# Patient Record
Sex: Female | Born: 1950 | Race: Black or African American | Hispanic: No | Smoking: Never smoker
Health system: Southern US, Community
[De-identification: ages and names within clinical notes are randomized; demographics above are authoritative.]

## PROBLEM LIST (undated history)

## (undated) DIAGNOSIS — K219 Gastro-esophageal reflux disease without esophagitis: Secondary | ICD-10-CM

## (undated) DIAGNOSIS — R002 Palpitations: Secondary | ICD-10-CM

## (undated) DIAGNOSIS — I10 Essential (primary) hypertension: Secondary | ICD-10-CM

---

## 2007-01-20 ENCOUNTER — Ambulatory Visit (HOSPITAL_COMMUNITY): Admission: RE | Admit: 2007-01-20 | Discharge: 2007-01-20 | Payer: Self-pay | Admitting: Family Medicine

## 2007-04-16 ENCOUNTER — Emergency Department (HOSPITAL_COMMUNITY): Admission: EM | Admit: 2007-04-16 | Discharge: 2007-04-16 | Payer: Self-pay | Admitting: Emergency Medicine

## 2007-04-20 ENCOUNTER — Emergency Department (HOSPITAL_COMMUNITY): Admission: EM | Admit: 2007-04-20 | Discharge: 2007-04-20 | Payer: Self-pay | Admitting: Emergency Medicine

## 2008-01-30 ENCOUNTER — Ambulatory Visit (HOSPITAL_COMMUNITY): Admission: RE | Admit: 2008-01-30 | Discharge: 2008-01-30 | Payer: Self-pay | Admitting: Family Medicine

## 2008-02-22 ENCOUNTER — Emergency Department (HOSPITAL_COMMUNITY): Admission: EM | Admit: 2008-02-22 | Discharge: 2008-02-22 | Payer: Self-pay | Admitting: Emergency Medicine

## 2009-04-29 ENCOUNTER — Ambulatory Visit (HOSPITAL_COMMUNITY): Admission: RE | Admit: 2009-04-29 | Discharge: 2009-04-29 | Payer: Self-pay | Admitting: Family Medicine

## 2011-01-04 ENCOUNTER — Encounter: Payer: Self-pay | Admitting: Family Medicine

## 2011-02-15 ENCOUNTER — Emergency Department (HOSPITAL_COMMUNITY): Payer: Self-pay

## 2011-02-15 ENCOUNTER — Emergency Department (HOSPITAL_COMMUNITY)
Admission: EM | Admit: 2011-02-15 | Discharge: 2011-02-15 | Disposition: A | Discharge status: Home / Self Care | Payer: Self-pay | Attending: Emergency Medicine | Admitting: Emergency Medicine

## 2011-02-15 DIAGNOSIS — I4949 Other premature depolarization: Secondary | ICD-10-CM | POA: Insufficient documentation

## 2011-02-15 DIAGNOSIS — E876 Hypokalemia: Secondary | ICD-10-CM | POA: Insufficient documentation

## 2011-02-15 LAB — DIFFERENTIAL
Basophils Absolute: 0 10*3/uL (ref 0.0–0.1)
Basophils Relative: 0 % (ref 0–1)
Eosinophils Relative: 5 % (ref 0–5)
Lymphs Abs: 2.3 10*3/uL (ref 0.7–4.0)
Monocytes Absolute: 0.6 10*3/uL (ref 0.1–1.0)
Monocytes Relative: 8 % (ref 3–12)

## 2011-02-15 LAB — CBC
Hemoglobin: 13.2 g/dL (ref 12.0–15.0)
MCH: 29 pg (ref 26.0–34.0)
MCV: 88.4 fL (ref 78.0–100.0)
Platelets: 286 10*3/uL (ref 150–400)
WBC: 6.9 10*3/uL (ref 4.0–10.5)

## 2011-02-15 LAB — POCT CARDIAC MARKERS
CKMB, poc: 1.1 ng/mL (ref 1.0–8.0)
Troponin i, poc: <0.05 ng/mL (ref 0.00–0.09)

## 2011-02-15 LAB — BASIC METABOLIC PANEL
CO2: 24 mEq/L (ref 19–32)
Chloride: 106 mEq/L (ref 96–112)
Creatinine, Ser: 0.81 mg/dL (ref 0.4–1.2)
Potassium: 3.3 mEq/L — ABNORMAL LOW (ref 3.5–5.1)

## 2011-02-18 ENCOUNTER — Emergency Department (HOSPITAL_COMMUNITY): Payer: Self-pay

## 2011-02-18 ENCOUNTER — Emergency Department (HOSPITAL_COMMUNITY)
Admission: EM | Admit: 2011-02-18 | Discharge: 2011-02-18 | Disposition: A | Discharge status: Home / Self Care | Payer: Self-pay | Attending: Emergency Medicine | Admitting: Emergency Medicine

## 2011-02-18 DIAGNOSIS — K219 Gastro-esophageal reflux disease without esophagitis: Secondary | ICD-10-CM | POA: Insufficient documentation

## 2011-02-18 DIAGNOSIS — R002 Palpitations: Secondary | ICD-10-CM | POA: Insufficient documentation

## 2011-02-18 LAB — BASIC METABOLIC PANEL
BUN: 10 mg/dL (ref 6–23)
Chloride: 104 mEq/L (ref 96–112)
Creatinine, Ser: 0.8 mg/dL (ref 0.4–1.2)
GFR calc Af Amer: >60 mL/min (ref >60)
Glucose, Bld: 85 mg/dL (ref 70–99)
Potassium: 4 mEq/L (ref 3.5–5.1)

## 2011-02-18 LAB — CBC
HCT: 39.4 % (ref 36.0–46.0)
Hemoglobin: 12.9 g/dL (ref 12.0–15.0)
MCH: 29.2 pg (ref 26.0–34.0)
MCHC: 32.7 g/dL (ref 30.0–36.0)
Platelets: 300 10*3/uL (ref 150–400)
RBC: 4.42 MIL/uL (ref 3.87–5.11)
WBC: 6.7 10*3/uL (ref 4.0–10.5)

## 2011-09-07 LAB — URINALYSIS, ROUTINE W REFLEX MICROSCOPIC
Glucose, UA: NEGATIVE
Hgb urine dipstick: NEGATIVE
Protein, ur: NEGATIVE
pH: 5.5

## 2011-09-07 LAB — URINE CULTURE

## 2011-09-09 ENCOUNTER — Emergency Department (HOSPITAL_COMMUNITY)
Admission: EM | Admit: 2011-09-09 | Discharge: 2011-09-09 | Disposition: A | Discharge status: Home / Self Care | Payer: Self-pay | Attending: Emergency Medicine | Admitting: Emergency Medicine

## 2011-09-09 ENCOUNTER — Encounter: Payer: Self-pay | Admitting: Emergency Medicine

## 2011-09-09 DIAGNOSIS — B372 Candidiasis of skin and nail: Secondary | ICD-10-CM | POA: Insufficient documentation

## 2011-09-09 DIAGNOSIS — X58XXXA Exposure to other specified factors, initial encounter: Secondary | ICD-10-CM | POA: Insufficient documentation

## 2011-09-09 DIAGNOSIS — T148XXA Other injury of unspecified body region, initial encounter: Secondary | ICD-10-CM | POA: Insufficient documentation

## 2011-09-09 MED ORDER — HYDROXYZINE HCL 25 MG PO TABS
25.0000 mg | ORAL_TABLET | Freq: Once | ORAL | Status: AC
  Administered 2011-09-09: 25 mg via ORAL
  Filled 2011-09-09: qty 1

## 2011-09-09 MED ORDER — CLOTRIMAZOLE 1 % EX CREA: TOPICAL_CREAM | CUTANEOUS | Status: AC

## 2011-09-09 MED ORDER — HYDROXYZINE HCL 25 MG PO TABS: 25.0000 mg | ORAL_TABLET | Freq: Four times a day (QID) | ORAL | Status: AC

## 2011-09-09 NOTE — ED Provider Notes (Signed)
History     CSN: [id number] Arrival date & time: 09/09/2011  1:36 PM  Chief Complaint  Patient presents with  . Rash    (Consider location/radiation/quality/duration/timing/severity/associated sxs/prior treatment) HPI Comments: Patient c/o itching rash to her perineal area and groin.  Also c/o itching and increased moisture to the area.  States she was seen at the health dept for same and treated with steroid cream w/o improvement.  She denies swelling, fever, or hx of diabetes or new detergents or chemical exposure.    Patient is a 60 y.o. female presenting with rash. The history is provided by the patient.  Rash  This is a new problem. The current episode started more than 1 week ago. The problem has not changed since onset.The problem is associated with nothing. There has been no fever. The rash is present on the groin and genitalia. The patient is experiencing no pain. Associated symptoms include itching and weeping. Pertinent negatives include no blisters and no pain. She has tried anti-itch cream and steriods for the symptoms. The treatment provided no relief.    History reviewed. No pertinent past medical history.  History reviewed. No pertinent past surgical history.  No family history on file.  History  Substance Use Topics  . Smoking status: Never Smoker   . Smokeless tobacco: Not on file  . Alcohol Use: No    OB History    Grav Para Term Preterm Abortions TAB SAB Ect Mult Living                  Review of Systems  Constitutional: Negative for fever and chills.  HENT: Negative for neck pain and neck stiffness.   Genitourinary: Negative for dysuria, vaginal bleeding, vaginal discharge and vaginal pain.  Musculoskeletal: Negative.   Skin: Positive for color change, itching and rash.  Neurological: Negative for weakness and numbness.  Hematological: Does not bruise/bleed easily.  All other systems reviewed and are negative.    Allergies  Codeine  Home  Medications  No current outpatient prescriptions on file.  BP 117/64  Pulse 61  Temp(Src) 98.5 F (36.9 C) (Oral)  Resp 20  Ht 5\' 2"  (1.575 m)  Wt 163 lb (73.936 kg)  BMI 29.81 kg/m2  SpO2 100%  Physical Exam  Nursing note and vitals reviewed. Constitutional: She is oriented to person, place, and time. She appears well-developed and well-nourished.  HENT:  Head: Normocephalic and atraumatic.  Mouth/Throat: Oropharynx is clear and moist.  Cardiovascular: Normal rate, regular rhythm and normal heart sounds.   Pulmonary/Chest: Effort normal and breath sounds normal.  Abdominal: Soft. She exhibits no distension and no mass. There is no tenderness. There is no rebound and no guarding.  Musculoskeletal: She exhibits no edema and no tenderness.  Neurological: She is alert and oriented to person, place, and time. She exhibits normal muscle tone. Coordination normal.  Skin: Laceration and rash noted. No bruising, no ecchymosis and no petechiae noted. Rash is macular. Rash is not nodular and not urticarial. There is erythema.       Confluent , erythematous macular rash to the bilateral groin and perineal region.  Mild weeping of the skin folds of the groin.     ED Course  Procedures (including critical care time)  Labs Reviewed - No data to display No results found.   No diagnosis found.    MDM    2:46 PM erythematous slightly weeping rash to the bilateral groin and perineal area.  Currently using triamcinolone cream w/o  improvement.  Rash appears fungal .  Will try clotrimazole and d/c her steroid cream.    09/09/2011 Patient / Family / Caregiver understand and agree with initial ED impression and plan with expectations set for ED visit.         Tammy L. Trisha Mangle, [place] 09/09/11 2125  Medical screening examination/treatment/procedure(s) were performed by non-physician practitioner and as supervising physician I was immediately available for  consultation/collaboration.Helmut Muster, MD 09/10/11 [phone]

## 2011-09-09 NOTE — ED Notes (Signed)
Pt c/o itchy rash to groin and upper thighs x 1 week. Pt seen at health dept and given triamcinolone cream. nad noted.

## 2011-12-29 ENCOUNTER — Other Ambulatory Visit (HOSPITAL_COMMUNITY): Payer: Self-pay | Admitting: Family Medicine

## 2011-12-29 DIAGNOSIS — N63 Unspecified lump in unspecified breast: Secondary | ICD-10-CM

## 2012-01-06 ENCOUNTER — Ambulatory Visit (HOSPITAL_COMMUNITY)
Admission: RE | Admit: 2012-01-06 | Discharge: 2012-01-06 | Disposition: A | Discharge status: Home / Self Care | Payer: PRIVATE HEALTH INSURANCE | Source: Ambulatory Visit | Attending: Family Medicine | Admitting: Family Medicine

## 2012-01-06 ENCOUNTER — Other Ambulatory Visit (HOSPITAL_COMMUNITY): Payer: Self-pay | Admitting: Family Medicine

## 2012-01-06 DIAGNOSIS — N63 Unspecified lump in unspecified breast: Secondary | ICD-10-CM

## 2013-02-04 ENCOUNTER — Emergency Department (HOSPITAL_COMMUNITY): Payer: Self-pay

## 2013-02-04 ENCOUNTER — Encounter (HOSPITAL_COMMUNITY): Payer: Self-pay | Admitting: *Deleted

## 2013-02-04 ENCOUNTER — Emergency Department (HOSPITAL_COMMUNITY)
Admission: EM | Admit: 2013-02-04 | Discharge: 2013-02-04 | Disposition: A | Discharge status: Home / Self Care | Payer: Self-pay | Attending: Emergency Medicine | Admitting: Emergency Medicine

## 2013-02-04 DIAGNOSIS — Z79899 Other long term (current) drug therapy: Secondary | ICD-10-CM | POA: Insufficient documentation

## 2013-02-04 DIAGNOSIS — R42 Dizziness and giddiness: Secondary | ICD-10-CM | POA: Insufficient documentation

## 2013-02-04 DIAGNOSIS — R002 Palpitations: Secondary | ICD-10-CM | POA: Insufficient documentation

## 2013-02-04 DIAGNOSIS — R51 Headache: Secondary | ICD-10-CM | POA: Insufficient documentation

## 2013-02-04 DIAGNOSIS — I499 Cardiac arrhythmia, unspecified: Secondary | ICD-10-CM | POA: Insufficient documentation

## 2013-02-04 LAB — COMPREHENSIVE METABOLIC PANEL
ALT: 22 U/L (ref 0–35)
Albumin: 3.8 g/dL (ref 3.5–5.2)
Alkaline Phosphatase: 110 U/L (ref 39–117)
BUN: 13 mg/dL (ref 6–23)
Calcium: 9.4 mg/dL (ref 8.4–10.5)
Creatinine, Ser: 0.99 mg/dL (ref 0.50–1.10)
GFR calc Af Amer: 69 mL/min — ABNORMAL LOW (ref >90)
GFR calc non Af Amer: 60 mL/min — ABNORMAL LOW (ref >90)
Potassium: 4 mEq/L (ref 3.5–5.1)
Sodium: 138 mEq/L (ref 135–145)
Total Bilirubin: 0.2 mg/dL — ABNORMAL LOW (ref 0.3–1.2)
Total Protein: 7.5 g/dL (ref 6.0–8.3)

## 2013-02-04 LAB — CBC
HCT: 36.9 % (ref 36.0–46.0)
MCHC: 33.1 g/dL (ref 30.0–36.0)

## 2013-02-04 MED ORDER — METOPROLOL TARTRATE 1 MG/ML IV SOLN
5.0000 mg | Freq: Once | INTRAVENOUS | Status: AC
  Administered 2013-02-04: 5 mg via INTRAVENOUS
  Filled 2013-02-04: qty 5

## 2013-02-04 MED ORDER — METOPROLOL TARTRATE 12.5 MG HALF TABLET: 100.0000 mg | ORAL_TABLET | Freq: Two times a day (BID) | ORAL | Status: DC

## 2013-02-04 NOTE — ED Notes (Signed)
Pt feels as if her heart is fluttering and causes her chest to "tingle" when it happens.

## 2013-02-04 NOTE — ED Provider Notes (Signed)
History    This chart was scribed for Molly Lennert, MD by Molly Cook, ED Scribe. The patient was seen in room APA12/[facility]12 and the patient's care was started at 7:18PM.    CSN: [id number]  Arrival date & time 02/04/13  4098   First MD Initiated Contact with Patient 02/04/13 1909      Chief Complaint  Patient presents with  . Irregular Heart Beat  . Chest Pain    (Consider location/radiation/quality/duration/timing/severity/associated sxs/prior treatment) Patient is a 62 y.o. female presenting with chest pain. The history is provided by the patient. No language interpreter was used.  Chest Pain Pain location:  Substernal area Pain radiates to:  Does not radiate Pain radiates to the back: no   Pain severity:  Moderate Onset quality:  Gradual Duration:  1 day Timing:  Intermittent Progression:  Worsening Chronicity:  Chronic Relieved by:  Nothing Worsened by:  Nothing tried Ineffective treatments:  None tried Associated symptoms: dizziness and headache   Associated symptoms: no fever and no shortness of breath   Associated symptoms comment:  Heart dysrhythmia  Molly Cook is a 62 y.o. female who presents to the Emergency Department complaining of intermittent, mild to moderate, central chest pain with associated heart dysrhythmias with an onset last night. She reports that she has had the chest pain for a while but it worsened yesterday when she started to have heart fluttering. She reports whenever her heart flutters, she has a headache with some dizziness that goes along with it and her chest pain gets aggravated. She reports cold intolerance related to "hormonal problems". No other pertinent medical symptoms.  PCP: [facility] a free clinic  History reviewed. No pertinent past medical history.  History reviewed. No pertinent past surgical history.  History reviewed. No pertinent family history.  History  Substance Use Topics  . Smoking status: Never Smoker   .  Smokeless tobacco: Not on file  . Alcohol Use: No    OB History   Grav Para Term Preterm Abortions TAB SAB Ect Mult Living                  Review of Systems  Constitutional: Negative for fever.  Respiratory: Negative for shortness of breath.   Cardiovascular: Positive for chest pain.  Neurological: Positive for dizziness and headaches.  All other systems reviewed and are negative.    Allergies  Codeine and Penicillins  Home Medications   Current Outpatient Rx  Name  Route  Sig  Dispense  Refill  . COD LIVER OIL PO   Oral   Take 1 tablet by mouth once.         . diphenhydrAMINE (ALLERGY) 25 MG tablet   Oral   Take 25 mg by mouth daily as needed for itching or allergies.         Marland Kitchen ibuprofen (ADVIL,MOTRIN) 200 MG tablet   Oral   Take 100 mg by mouth as needed for pain (for arthritis in hand).         . Multiple Vitamins-Minerals (ALIVE WOMENS 50+) TABS   Oral   Take 0.5 tablets by mouth once.           BP 136/61  Pulse 63  Temp(Src) 97.7 F (36.5 C) (Oral)  Resp 20  Ht 5\' 2"  (1.575 m)  Wt 163 lb (73.936 kg)  BMI 29.81 kg/m2  SpO2 99%  Physical Exam  Constitutional: She is oriented to person, place, and time. She appears well-developed.  HENT:  Head: Normocephalic and atraumatic.  Eyes: Conjunctivae and EOM are normal. No scleral icterus.  Neck: Neck supple. No thyromegaly present.  Cardiovascular: Normal rate.  An irregular rhythm present. Exam reveals no gallop and no friction rub.   No murmur heard. Pulmonary/Chest: No stridor. She has no wheezes. She has no rales. She exhibits no tenderness.  Abdominal: She exhibits no distension. There is no tenderness. There is no rebound.  Musculoskeletal: Normal range of motion. She exhibits no edema.  Lymphadenopathy:    She has no cervical adenopathy.  Neurological: She is oriented to person, place, and time. Coordination normal.  Skin: No rash noted. No erythema.  Psychiatric: She has a normal mood  and affect. Her behavior is normal.    ED Course  Procedures (including critical care time)  DIAGNOSTIC STUDIES: Oxygen Saturation is 99% on room air, normal by my interpretation.    COORDINATION OF CARE:  7:22PM - metoprolol, CXR, CBC, CMP, and troponin I will be ordered for Molly Cook.   8:40PM - lab results reviewed Labs Reviewed  COMPREHENSIVE METABOLIC PANEL - Abnormal; Notable for the following:    Total Bilirubin 0.2 (*)    GFR calc non Af Amer 60 (*)    GFR calc Af Amer 69 (*)    All other components within normal limits  CBC  TROPONIN I   9:30PM - imaging results reviewed and are unremarkable. Dg Chest 2 View  02/04/2013  *RADIOLOGY REPORT*  Clinical Data: Intermittent chronic central chest pain.  CHEST - 2 VIEW  Comparison: 10/23/2012 at [facility].  Findings: Normal sized heart.  Clear lungs with normal vascularity. Lower cervical spine facet degenerative changes.  IMPRESSION: No acute abnormality.   Original Report Authenticated By: Beckie Salts, M.D.      No diagnosis found.   Date: 02/04/2013  [id number]  Rhythm: normal sinus rhythm  With pvc  QRS Axis: normal  Intervals: normal  ST/T Wave abnormalities: nonspecific ST changes  Conduction Disutrbances:none  Narrative Interpretation:   Old EKG Reviewed: none available    MDM    The chart was scribed for me under my direct supervision.  I personally performed the history, physical, and medical decision making and all procedures in the evaluation of this patient.Molly Lennert, MD 02/04/13 [phone]

## 2013-02-05 NOTE — ED Notes (Signed)
Angie with [facility] pharmacy called requesting clarification of lopressor prescription.  Spoke with Dr. Estell Harpin and instructed pharmacist to tell pt to take 12.5mg  po bid dispense 30 tabs.

## 2013-02-22 ENCOUNTER — Encounter: Payer: Self-pay | Admitting: Cardiovascular Disease

## 2013-03-09 ENCOUNTER — Encounter: Payer: Self-pay | Admitting: Internal Medicine

## 2013-05-27 ENCOUNTER — Emergency Department (HOSPITAL_COMMUNITY)
Admission: EM | Admit: 2013-05-27 | Discharge: 2013-05-27 | Disposition: A | Discharge status: Home / Self Care | Payer: Self-pay | Attending: Emergency Medicine | Admitting: Emergency Medicine

## 2013-05-27 ENCOUNTER — Encounter (HOSPITAL_COMMUNITY): Payer: Self-pay

## 2013-05-27 DIAGNOSIS — R6889 Other general symptoms and signs: Secondary | ICD-10-CM | POA: Insufficient documentation

## 2013-05-27 DIAGNOSIS — Z880 Allergy status to penicillin: Secondary | ICD-10-CM | POA: Insufficient documentation

## 2013-05-27 DIAGNOSIS — J329 Chronic sinusitis, unspecified: Secondary | ICD-10-CM | POA: Insufficient documentation

## 2013-05-27 DIAGNOSIS — Y939 Activity, unspecified: Secondary | ICD-10-CM | POA: Insufficient documentation

## 2013-05-27 DIAGNOSIS — S90861A Insect bite (nonvenomous), right foot, initial encounter: Secondary | ICD-10-CM

## 2013-05-27 DIAGNOSIS — R197 Diarrhea, unspecified: Secondary | ICD-10-CM | POA: Insufficient documentation

## 2013-05-27 DIAGNOSIS — Z8719 Personal history of other diseases of the digestive system: Secondary | ICD-10-CM | POA: Insufficient documentation

## 2013-05-27 DIAGNOSIS — Y929 Unspecified place or not applicable: Secondary | ICD-10-CM | POA: Insufficient documentation

## 2013-05-27 DIAGNOSIS — IMO0002 Reserved for concepts with insufficient information to code with codable children: Secondary | ICD-10-CM | POA: Insufficient documentation

## 2013-05-27 DIAGNOSIS — J3489 Other specified disorders of nose and nasal sinuses: Secondary | ICD-10-CM | POA: Insufficient documentation

## 2013-05-27 HISTORY — DX: Palpitations: R00.2

## 2013-05-27 HISTORY — DX: Gastro-esophageal reflux disease without esophagitis: K21.9

## 2013-05-27 MED ORDER — DOXYCYCLINE HYCLATE 100 MG PO TABS: 100.0000 mg | ORAL_TABLET | Freq: Two times a day (BID) | ORAL | Status: DC

## 2013-05-27 NOTE — ED Provider Notes (Signed)
History     CSN: [id number]  Arrival date & time 05/27/13  1153   First MD Initiated Contact with Patient 05/27/13 1308      Chief Complaint  Patient presents with  . Headache    HPI Pt was seen at 1310.  Per pt, c/o gradual onset and persistence of constant multiple symptoms that began 1 to 2 days ago. Pt states she has had a dull vertex headache, described as "pressure." Has been associated with sinus and ears congestion, nasal congestion, sneezing, runny nose and "fingertips tingling." Pt states she took an OTC benadryl with relief of all her symptoms. Pt also c/o intermittent generalized abd "cramping" associated with loose stools that began today. Pt states all her symptoms have started approx 3 to 4 days after she "found a tick on me and pulled it off."  Pt concerned re: RMSF.  Denies fevers, no N/V/D, no abd pain, no neck or back pain, no focal motor weakness, no tingling/numbness in extremities, no visual changes, no slurred speech, no facial droop, no rash. Denies headache was sudden or maximal in onset or at any time.       Past Medical History  Diagnosis Date  . GERD (gastroesophageal reflux disease)   . Palpitations     History reviewed. No pertinent past surgical history.   History  Substance Use Topics  . Smoking status: Never Smoker   . Smokeless tobacco: Not on file  . Alcohol Use: No      Review of Systems ROS: Statement: All systems negative except as marked or noted in the HPI; Constitutional: Negative for fever and chills. ; ; Eyes: Negative for eye pain, redness and discharge. ; ; ENMT: Negative for ear pain, hoarseness, sore throat. +sneezing, nasal and ears congestion, sinus pressure. ; ; Cardiovascular: Negative for chest pain, palpitations, diaphoresis, dyspnea and peripheral edema. ; ; Respiratory: Negative for cough, wheezing and stridor. ; ; Gastrointestinal: +abd "cramps," "loose stools." Negative for nausea, vomiting, abdominal pain, blood in stool,  hematemesis, jaundice and rectal bleeding. . ; ; Genitourinary: Negative for dysuria, flank pain and hematuria. ; ; Musculoskeletal: Negative for back pain and neck pain. Negative for swelling and trauma.; ; Skin: Negative for pruritus, rash, abrasions, blisters, bruising and skin lesion.; ; Neuro: +headache, paresthesias. Negative for lightheadedness and neck stiffness. Negative for weakness, altered level of consciousness , altered mental status, extremity weakness, involuntary movement, seizure and syncope.       Allergies  Codeine and Penicillins  Home Medications   Current Outpatient Rx  Name  Route  Sig  Dispense  Refill  . diphenhydrAMINE (ALLERGY) 25 MG tablet   Oral   Take 25 mg by mouth daily as needed for itching or allergies.         Marland Kitchen ibuprofen (ADVIL,MOTRIN) 200 MG tablet   Oral   Take 100 mg by mouth as needed for pain (for arthritis in hand).         . doxycycline (VIBRA-TABS) 100 MG tablet   Oral   Take 1 tablet (100 mg total) by mouth 2 (two) times daily.   28 tablet   0     BP 125/65  Pulse 65  Temp(Src) 98.1 F (36.7 C) (Oral)  Resp 18  Ht 5\' 2"  (1.575 m)  Wt 163 lb (73.936 kg)  BMI 29.81 kg/m2  SpO2 99%  Physical Exam 1315: Physical examination:  Nursing notes reviewed; Vital signs and O2 SAT reviewed;  Constitutional: Well developed, Well nourished,  Well hydrated, In no acute distress; Head:  Normocephalic, atraumatic; Eyes: EOMI, PERRL, No scleral icterus; ENMT: +clear fluid behind TM's bilat. TM's not erythematous or bulging. +edemetous nasal turbinates bilat with clear rhinorrhea. Mouth and pharynx without lesions. No tonsillar exudates. No intra-oral edema. No submandibular or sublingual edema. No hoarse voice, no drooling, no stridor. No pain with manipulation of larynx. Mouth and pharynx normal, Mucous membranes moist; Neck: Supple, Full range of motion, No lymphadenopathy; Cardiovascular: Regular rate and rhythm, No gallop; Respiratory: Breath  sounds clear & equal bilaterally, No rales, rhonchi, wheezes.  Speaking full sentences with ease, Normal respiratory effort/excursion; Chest: Nontender, Movement normal; Abdomen: Soft, Nontender, Nondistended, Normal bowel sounds; Genitourinary: No CVA tenderness; Spine:  No midline CS, TS, LS tenderness.;; Extremities: Pulses normal, No tenderness, No edema, No calf edema or asymmetry.; Neuro: AA&Ox3, Major CN grossly intact. No facial droop. Speech clear. Climbs on and off stretcher easily by herself. Gait steady. No gross focal motor or sensory deficits in extremities.; Skin: Color normal, Warm, Dry, +punctate area of erythema right lateral proximal foot without open wound, drainage, ecchymosis, tenderness or obvious FB.   ED Course  Procedures     MDM  MDM Reviewed: previous chart, nursing note and vitals     1330:  Pt with vertex headache "pressure," as well as "pressure" and "congestion" in her ears and face when she lays down to sleep. Assoc with sneezing, sinus congestion, runny/stuffy nose; improved with benadryl.  Appears sinusitis/allergies, will tx symptomatically. Pt also c/o various other symptoms: "tingling fingertips," abd cramps, loose stools. Neuro exam intact today, abd exam benign. Pt very concerned that all her symptoms began approx 3 to 4 days after she "pulled a tick off of me" and that they are related to the tick bite. Tick bite site without infection. 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 Pt and her husband both voice that that was their concern. Want to go home now. Dx d/w pt and family.  Questions answered.  Verb understanding, agreeable to d/c home with outpt f/u.        Laray Anger, DO 05/29/13 1346

## 2013-05-27 NOTE — ED Notes (Signed)
Pt presents with c/o headache, sinus pressure that increases when lying down. Reports taking a benadryl product 2 days ago that seemed to ease symptoms. Pt also reports hands tingling and feeling numb x 1 week. Diarrhea started today on way to facility. Reports 3 bouts of diarrhea since leaving her home 2 hrs ago.  NAD noted. Denies N/V  And fever at this time. No vision changes.

## 2013-05-27 NOTE — ED Notes (Signed)
Pt reports pressure in top of head and numbness in both hands x 1 week.  Also c/o intermittent abd cramps.  Reports diarrhea today, no vomiting.

## 2013-11-25 ENCOUNTER — Emergency Department (HOSPITAL_COMMUNITY)
Admission: EM | Admit: 2013-11-25 | Discharge: 2013-11-25 | Disposition: A | Discharge status: Home / Self Care | Payer: Self-pay | Attending: Emergency Medicine | Admitting: Emergency Medicine

## 2013-11-25 ENCOUNTER — Encounter (HOSPITAL_COMMUNITY): Payer: Self-pay | Admitting: Emergency Medicine

## 2013-11-25 DIAGNOSIS — M25559 Pain in unspecified hip: Secondary | ICD-10-CM | POA: Insufficient documentation

## 2013-11-25 DIAGNOSIS — Z792 Long term (current) use of antibiotics: Secondary | ICD-10-CM | POA: Insufficient documentation

## 2013-11-25 DIAGNOSIS — Z8719 Personal history of other diseases of the digestive system: Secondary | ICD-10-CM | POA: Insufficient documentation

## 2013-11-25 DIAGNOSIS — Z79899 Other long term (current) drug therapy: Secondary | ICD-10-CM | POA: Insufficient documentation

## 2013-11-25 DIAGNOSIS — Z880 Allergy status to penicillin: Secondary | ICD-10-CM | POA: Insufficient documentation

## 2013-11-25 DIAGNOSIS — M545 Low back pain, unspecified: Secondary | ICD-10-CM | POA: Insufficient documentation

## 2013-11-25 DIAGNOSIS — Z8619 Personal history of other infectious and parasitic diseases: Secondary | ICD-10-CM | POA: Insufficient documentation

## 2013-11-25 DIAGNOSIS — R109 Unspecified abdominal pain: Secondary | ICD-10-CM | POA: Insufficient documentation

## 2013-11-25 LAB — URINALYSIS, ROUTINE W REFLEX MICROSCOPIC
Bilirubin Urine: NEGATIVE
Hgb urine dipstick: NEGATIVE
Specific Gravity, Urine: <1.005 — ABNORMAL LOW (ref 1.005–1.030)
pH: 6 (ref 5.0–8.0)

## 2013-11-25 MED ORDER — CYCLOBENZAPRINE HCL 10 MG PO TABS: 10.0000 mg | ORAL_TABLET | Freq: Three times a day (TID) | ORAL | Status: DC | PRN

## 2013-11-25 MED ORDER — NAPROXEN 500 MG PO TABS: 500.0000 mg | ORAL_TABLET | Freq: Two times a day (BID) | ORAL | Status: DC

## 2013-11-25 MED ORDER — IBUPROFEN 800 MG PO TABS
800.0000 mg | ORAL_TABLET | Freq: Once | ORAL | Status: AC
  Administered 2013-11-25: 800 mg via ORAL
  Filled 2013-11-25: qty 1

## 2013-11-25 NOTE — ED Notes (Signed)
Pt c/o left sided hip, back, and flank pain. No urinary symptoms.

## 2013-11-25 NOTE — ED Provider Notes (Signed)
CSN: [id number]     Arrival date & time 11/25/13  2029 History   First MD Initiated Contact with Patient 11/25/13 2112     Chief Complaint  Patient presents with  . Hip Pain  . Flank Pain   (Consider location/radiation/quality/duration/timing/severity/associated sxs/prior Treatment) Patient is a 62 y.o. female presenting with hip pain and flank pain.  Hip Pain  Flank Pain   Pt reports several weeks of intermittent moderate aching pain in L lower back radiating into her L buttock and worse with movement. Pt states she was seen by MD in [place] and told she had 'an infection' and put on Clindamycin which she was taking intermittently when she had pain. Has also been taking APAP with some relief. She is currently asymptomatic.    Past Medical History  Diagnosis Date  . GERD (gastroesophageal reflux disease)   . Palpitations    History reviewed. No pertinent past surgical history. History reviewed. No pertinent family history. History  Substance Use Topics  . Smoking status: Never Smoker   . Smokeless tobacco: Not on file  . Alcohol Use: No   OB History   Grav Para Term Preterm Abortions TAB SAB Ect Mult Living                 Review of Systems  Genitourinary: Positive for flank pain.   All other systems reviewed and are negative except as noted in HPI.   Allergies  Codeine and Penicillins  Home Medications   Current Outpatient Rx  Name  Route  Sig  Dispense  Refill  . acetaminophen (TYLENOL) 500 MG tablet   Oral   Take 500 mg by mouth every 6 (six) hours as needed.         . clindamycin (CLEOCIN) 300 MG capsule   Oral   Take 300 mg by mouth every 8 (eight) hours. 10 day course starting on 11/06/2013         . Cod Liver Oil CAPS   Oral   Take 1 capsule by mouth daily.          BP 152/66  Pulse 68  Temp(Src) 97.8 F (36.6 C) (Oral)  Resp 20  Ht 5\' 2"  (1.575 m)  Wt 170 lb (77.111 kg)  BMI 31.09 kg/m2  SpO2 98% Physical Exam  Nursing note and  vitals reviewed. Constitutional: She is oriented to person, place, and time. She appears well-developed and well-nourished.  HENT:  Head: Normocephalic and atraumatic.  Eyes: EOM are normal. Pupils are equal, round, and reactive to light.  Neck: Normal range of motion. Neck supple.  Cardiovascular: Normal rate, normal heart sounds and intact distal pulses.   Pulmonary/Chest: Effort normal and breath sounds normal.  Abdominal: Bowel sounds are normal. She exhibits no distension. There is no tenderness.  Musculoskeletal: Normal range of motion. She exhibits no edema and no tenderness.  Neurological: She is alert and oriented to person, place, and time. She has normal strength. She displays normal reflexes. No cranial nerve deficit or sensory deficit.  Skin: Skin is warm and dry. No rash noted.  Psychiatric: She has a normal mood and affect.    ED Course  Procedures (including critical care time) Labs Review Labs Reviewed  URINALYSIS, ROUTINE W REFLEX MICROSCOPIC - Abnormal; Notable for the following:    Specific Gravity, Urine <1.005 (*)    All other components within normal limits   Imaging Review No results found.  EKG Interpretation   None  MDM   1. Low back pain     Likely muscle spasm/lumbago vs sciatica. No concern for UTI or other renal disease. NSAID, flexeril, PCP followup.     Charles B. Bernette Mayers, MD 11/25/13 2120

## 2014-09-03 ENCOUNTER — Other Ambulatory Visit (HOSPITAL_COMMUNITY): Payer: Self-pay | Admitting: Physician Assistant

## 2014-09-03 DIAGNOSIS — Z1231 Encounter for screening mammogram for malignant neoplasm of breast: Secondary | ICD-10-CM

## 2014-09-14 ENCOUNTER — Ambulatory Visit (HOSPITAL_COMMUNITY): Payer: Self-pay

## 2014-09-21 ENCOUNTER — Ambulatory Visit (HOSPITAL_COMMUNITY): Payer: Self-pay

## 2014-11-26 ENCOUNTER — Emergency Department (HOSPITAL_COMMUNITY)
Admission: EM | Admit: 2014-11-26 | Discharge: 2014-11-26 | Disposition: A | Discharge status: Home / Self Care | Payer: Self-pay | Attending: Emergency Medicine | Admitting: Emergency Medicine

## 2014-11-26 ENCOUNTER — Encounter (HOSPITAL_COMMUNITY): Payer: Self-pay | Admitting: *Deleted

## 2014-11-26 ENCOUNTER — Emergency Department (HOSPITAL_COMMUNITY): Payer: Self-pay

## 2014-11-26 DIAGNOSIS — R002 Palpitations: Secondary | ICD-10-CM | POA: Insufficient documentation

## 2014-11-26 DIAGNOSIS — Z791 Long term (current) use of non-steroidal anti-inflammatories (NSAID): Secondary | ICD-10-CM | POA: Insufficient documentation

## 2014-11-26 DIAGNOSIS — Z79899 Other long term (current) drug therapy: Secondary | ICD-10-CM | POA: Insufficient documentation

## 2014-11-26 DIAGNOSIS — Z880 Allergy status to penicillin: Secondary | ICD-10-CM | POA: Insufficient documentation

## 2014-11-26 DIAGNOSIS — Z8719 Personal history of other diseases of the digestive system: Secondary | ICD-10-CM | POA: Insufficient documentation

## 2014-11-26 LAB — CBC WITH DIFFERENTIAL/PLATELET
BASOS ABS: 0 10*3/uL (ref 0.0–0.1)
BASOS PCT: 0 % (ref 0–1)
EOS PCT: 2 % (ref 0–5)
Eosinophils Absolute: 0.2 10*3/uL (ref 0.0–0.7)
HEMATOCRIT: 43.8 % (ref 36.0–46.0)
Hemoglobin: 14.6 g/dL (ref 12.0–15.0)
LYMPHS PCT: 33 % (ref 12–46)
Lymphs Abs: 2.2 10*3/uL (ref 0.7–4.0)
MCH: 29.4 pg (ref 26.0–34.0)
MCHC: 33.3 g/dL (ref 30.0–36.0)
MCV: 88.1 fL (ref 78.0–100.0)
MONO ABS: 0.5 10*3/uL (ref 0.1–1.0)
Monocytes Relative: 8 % (ref 3–12)
NEUTROS ABS: 3.7 10*3/uL (ref 1.7–7.7)
Neutrophils Relative %: 57 % (ref 43–77)
PLATELETS: 310 10*3/uL (ref 150–400)
RBC: 4.97 MIL/uL (ref 3.87–5.11)
RDW: 14 % (ref 11.5–15.5)
WBC: 6.6 10*3/uL (ref 4.0–10.5)

## 2014-11-26 LAB — BASIC METABOLIC PANEL
ANION GAP: 14 (ref 5–15)
BUN: 8 mg/dL (ref 6–23)
CALCIUM: 10.1 mg/dL (ref 8.4–10.5)
CHLORIDE: 101 meq/L (ref 96–112)
CO2: 24 mEq/L (ref 19–32)
CREATININE: 0.83 mg/dL (ref 0.50–1.10)
GFR calc non Af Amer: 73 mL/min — ABNORMAL LOW (ref >90)
GFR, EST AFRICAN AMERICAN: 85 mL/min — AB (ref >90)
Glucose, Bld: 105 mg/dL — ABNORMAL HIGH (ref 70–99)
Potassium: 3.4 mEq/L — ABNORMAL LOW (ref 3.7–5.3)
SODIUM: 139 meq/L (ref 137–147)

## 2014-11-26 LAB — MAGNESIUM: Magnesium: 2.3 mg/dL (ref 1.5–2.5)

## 2014-11-26 LAB — TROPONIN I

## 2014-11-26 MED ORDER — POTASSIUM CHLORIDE CRYS ER 20 MEQ PO TBCR
40.0000 meq | EXTENDED_RELEASE_TABLET | Freq: Once | ORAL | Status: AC
  Administered 2014-11-26: 40 meq via ORAL
  Filled 2014-11-26: qty 2

## 2014-11-26 NOTE — Discharge Instructions (Signed)
°Emergency Department Resource Guide °1) Find a Doctor and Pay Out of Pocket °Although you won't have to find out who is covered by your insurance plan, it is a good idea to ask around and get recommendations. You will then need to call the office and see if the doctor you have chosen will accept you as a new patient and what types of options they offer for patients who are self-pay. Some doctors offer discounts or will set up payment plans for their patients who do not have insurance, but you will need to ask so you aren't surprised when you get to your appointment. ° °2) Contact Your Local Health Department °Not all health departments have doctors that can see patients for sick visits, but many do, so it is worth a call to see if yours does. If you don't know where your local health department is, you can check in your phone book. The CDC also has a tool to help you locate your state's health department, and many state websites also have listings of all of their local health departments. ° °3) Find a Walk-in Clinic °If your illness is not likely to be very severe or complicated, you may want to try a walk in clinic. These are popping up all over the country in pharmacies, drugstores, and shopping centers. They're usually staffed by nurse practitioners or physician assistants that have been trained to treat common illnesses and complaints. They're usually fairly quick and inexpensive. However, if you have serious medical issues or chronic medical problems, these are probably not your best option. ° °No Primary Care Doctor: °- Call [facility] at  832-8000 - they can help you locate a primary care doctor that  accepts your insurance, provides certain services, etc. °- Physician Referral Service- 1-800-533-3463 ° °Chronic Pain Problems: °Organization         Address  Phone   Notes  °[facility]  (336) 297-2271 Patients need to be referred by their primary care doctor.  ° °Medication  Assistance: °Organization         Address  Phone   Notes  °[place] [facility] [address] °[place], [place] [address] (336) 641-8030 --Must be a resident of [place] °-- Must have NO insurance coverage whatsoever (no Medicaid/ Medicare, etc.) °-- The pt. MUST have a primary care doctor that directs their care regularly and follows them in the community °  °[facility]  (866) 331-1348   °[employer]  (888) 892-1162   ° °Agencies that provide inexpensive medical care: °Organization         Address  Phone   Notes  °[facility]  (336) 832-8035   °[facility]    (336) 832-7272   °[facility] [address] °[place], [place] [address] (336) 832-4777   °[facility] [address], °[place] (336) 271-4999   °[facility]    (336) 373-0678   °[facility]    (336) 272-1050   °[facility] ° [address], [place] Phone:  (336) 832-4444, Fax:  (336) 832-4440 Hours of Operation:  9 am - 6 pm, M-F.  Also accepts Medicaid/Medicare and self-pay.  °[facility] ° [address], [place] Phone: (336) 832-3150, Fax: (336) [phone]. Hours of Operation:  8:30 am - 5:30 pm, M-F.  Also accepts Medicaid and self-pay.  °[facility] [place] [address], [place] Phone: (336) 878-6027   °[facility] [address], [place], [place] (336)723-1848, Ext. 123 Mondays & Thursdays: 7-9 AM.  First 15 patients are seen on a first come, first serve basis. °  ° °Medicaid-accepting [place] Providers: ° °Organization         Address  Phone   Notes  °[facility] [address], [place] (336) 641-2100 Also accepts self-pay patients.  °[facility] [address], [place] ° (336) 856-9996   °[facility] [address], [place]  (336) 288-8857   °[facility] [address] [place] [address], [place] (336) 299-7000   °Veita Bland [address], [place]  ° (336) 373-1557 Only accepts [place] Access Medicaid patients after they have their name applied to their card.  ° °Self-Pay (no insurance) in [place]: ° °Organization         Address  Phone   Notes  °Sickle Cell Patients, [facility] [address], [place] (336) 832-1970   °[facility] [address], [place] (336) 832-4400   °[facility] ° [address], [place] (336) 992-4800   °[facility]/Dr. Osei-Bonsu ° [address] [place] [address], [place] or [address], [place] (336) [phone] Phone number for both [place] and [place] locations is the same.  °[facility] [address], [place] (336) 299-0000   °[facility] [address] [place] [address], [place] or [address] (336) 852-7530 °(336) 878-2260   °[facility] [address], [place] (336) 350-1642, phone; (336) 294-5005, fax Sees patients 1st and 3rd Saturday of every month.  Must not qualify for public or private insurance (i.e. Medicaid, Medicare, [place] Health Choice, Veterans' Benefits) • Household income should be no more than 200% of the poverty level •The clinic cannot treat you if you are pregnant or think you are pregnant • Sexually transmitted diseases are not treated at the clinic.  ° ° °Dental Care: °Organization         Address  Phone  Notes  °[place] [facility] [address], [place] (336) 641-6152 Accepts children up to age 21 who are enrolled in Medicaid or [place] Health Choice; pregnant women with a Medicaid card; and children who have applied for Medicaid or [place] Health Choice, but were declined, whose parents can pay a reduced fee at time of service.  °[place]  [facility] [place]  [address], [place] (336) 641-7733 Accepts children up to age 21 who are enrolled in Medicaid or [place] Health Choice; pregnant women with a Medicaid card; and children who have applied for Medicaid or [place] Health Choice, but were declined, whose parents can pay a reduced fee at time of service.  °[facility] ° [address], [place] (336) 641-4533 Patients are seen by appointment only. Walk-ins are not accepted. [facility] will see patients 18 years of age and older. °Monday - Tuesday (8am-5pm) °Most Wednesdays (8:30-5pm) °$30 per visit, cash only  °[facility] ° [address], [place] (336) 641-4533 Patients are seen by appointment only. Walk-ins are not accepted. [facility] will see patients 18 years of age and older. °One   Wednesday Evening (Monthly: Volunteer Based).  $30 per visit, cash only  °[facility]  (919) 537-3737 for adults; Children under age 4, call Graduate Pediatric Dentistry at ([phone]) [phone]. Children aged 4-14, please call (919) [phone] to request a pediatric application. ° Dental services are provided in all areas of dental care including fillings, crowns and bridges, complete and partial dentures, implants, gum treatment, root canals, and extractions. Preventive care is also provided. Treatment is provided to both adults and children. °Patients are selected via a lottery and there is often a waiting list. °  °[facility] [address], °[place] ° (336) 763-8833 [email or web address] °  °[facility] [address], [place], [place] (336)723-1848, Ext. 123 Second and Fourth Thursday of each month, opens at 6:30 AM; Clinic ends at 9 AM.  Patients are seen on a first-come first-served basis, and a limited number are seen during each clinic.  ° °[facility] ° [address], [place], [place] (336) 723-7904    Eligibility Requirements °You must have lived in [place], [place], or [place] counties for at least the last three months. °  You cannot be eligible for state or federal sponsored healthcare insurance, including Veterans Administration, Medicaid, or Medicare. °  You generally cannot be eligible for healthcare insurance through your employer.  °  How to apply: °Eligibility screenings are held every Tuesday and Wednesday afternoon from 1:00 pm until 4:00 pm. You do not need an appointment for the interview!  °[facility] [address], [place], [place] 336-631-2330   °[facility]  336-342-8273   °[facility]  336-703-3100   °[facility]  336-570-6415   ° °Behavioral Health Resources in the Community: °Intensive Outpatient Programs °Organization         Address  Phone  Notes  °[facility] [address], [place], [place] 336-878-6098   °[facility] [address], [place], [place] 336-832-9800   °[facility] [address], [place], [place] ° 336-882-2125   °[facility] [address],  °[place], [place] 1-800-853-5163 or 336-641-4981   °Substance Abuse Resources °Organization         Address  Phone  Notes  °[facility]  336-882-2125   °[facility]  336-784-9470   °[facility]  336-285-9073   °[facility]  336-845-3988   °Residential & Outpatient Substance Abuse Program  1-800-659-3381   °Psychological Services °Organization         Address  Phone  Notes  °[facility]  336- 832-9600   °[facility]  336- 378-7881   °[facility] [address], [place] 1-800-853-5163 or 336-641-4981   ° °Mobile Crisis Teams °Organization         Address  Phone  Notes  °[facility], Mobile Crisis Care Unit  1-877-626-1772   °Assertive °[facility] ° [address]  [place], [place] 336-834-9664   °Sharon DeEsch [address] °[place] 336-554-5454   ° °Self-Help/Support Groups °Organization         Address  Phone             Notes  °[facility] of  - variety of support groups  336- 373-1402 Call for more information  °Narcotics Anonymous (NA), [facility] [address], °[place]  2 meetings at this location  ° °  Residential Treatment Programs Organization         Address  Phone  Notes  [facility] [address],    [place]  [phone]   [facility]  [address], [place] [phone], [place], [place] [phone] [phone]   [facility] [address], [place] [phone] Admissions: 8am-3pm M-F  [facility] [address]    [place], [place] [phone]   [facility] [address], [place], [place] [phone]   [facility] [address]  [place], [place] [phone]   [facility] Intensive Outpatient [address], [place], [place] [phone]) [phone]   [facility] ([facility] [address]  [place], [place] [phone]) [phone] or [phone]   [facility] ([facility] [address] [place], [place] 323-557-3220(415)[phone] Accepts Medicaid  [facility] [address]  [place] [phone] Substance Abuse/Addiction Treatment   [facility]         Address  Phone  Notes  [facility]  256-650-9569(888) (321)[phone]   Angie FavaJulie Brannon, PhD [address], [address] [place], [place]   ([phone]) [phone](336) (772)[phone] or [phone](336) ([phone]   [facility]   [address] [place], [place] [phone](336) [phone]   [facility] [address], [place], [place] [phone](336) [phone] Insurance/Medicaid/sponsorship through [facility] [address]                                    [place], [place] [phone](336) [phone] Therapy/tele-psych/case    [facility] [address]   [place], [place] [phone](336) [phone]    Dr. Lolly MustacheArfeen  [phone](336) [phone]   [facility]  [employer] [facility] 1) [address], [place] 2) [address], [place] 3)  [address], [place] ([phone](336) [phone](336) (704) [phone]  [phone](336) [phone]   [facility] (616) 102-1045(336) (701) 827-4036 or 8280019253(336) [phone] (After Hours)      Avoid avoid caffinated products, such as teas, colas, coffee, chocolate. Avoid over the counter cold medicines, herbal or "natural vitamin" products, and illicit drugs because they can contain stimulants.  Call the Cardiologist tomorrow to schedule a follow up appointment within the next week; they may want to place a Holter monitor to further evaluate your palpitations.  Return to the Emergency Department immediately if worsening.

## 2014-11-26 NOTE — ED Provider Notes (Signed)
CSN: [id number]     Arrival date & time 11/26/14  1503 History   First MD Initiated Contact with Patient 11/26/14 1510     Chief Complaint  Patient presents with  . Palpitations      HPI Pt was seen at 1515.  Per pt, c/o gradual onset and persistence of multiple intermittent episodes of "palpitations" that began 3 days ago.  Pt describes the palpitations as "my heart does flip flops" and "flutters" for a few minutes before stopping and returning again. Pt has not taken her HR during these episodes.  Pt endorses several year hx of palpitations, has not f/u with PMD or Cards MD for same. States her "fluttering" worsens when she gets anxious/sad "thinking about my husband that died" this past year. Denies symptoms occur with specific activity/exertion. Denies CP, no SOB/cough, no abd pain, no N/V/D, no fevers, no back pain, no SI.     Past Medical History  Diagnosis Date  . GERD (gastroesophageal reflux disease)   . Palpitations    History reviewed. No pertinent past surgical history.  History  Substance Use Topics  . Smoking status: Never Smoker   . Smokeless tobacco: Not on file  . Alcohol Use: No    Review of Systems ROS: Statement: All systems negative except as marked or noted in the HPI; Constitutional: Negative for fever and chills. ; ; Eyes: Negative for eye pain, redness and discharge. ; ; ENMT: Negative for ear pain, hoarseness, nasal congestion, sinus pressure and sore throat. ; ; Cardiovascular: +palpitations. Negative for chest pain, diaphoresis, dyspnea and peripheral edema. ; ; Respiratory: Negative for cough, wheezing and stridor. ; ; Gastrointestinal: Negative for nausea, vomiting, diarrhea, abdominal pain, blood in stool, hematemesis, jaundice and rectal bleeding. . ; ; Genitourinary: Negative for dysuria, flank pain and hematuria. ; ; Musculoskeletal: Negative for back pain and neck pain. Negative for swelling and trauma.; ; Skin: Negative for pruritus, rash, abrasions,  blisters, bruising and skin lesion.; ; Neuro: Negative for headache, lightheadedness and neck stiffness. Negative for weakness, altered level of consciousness , altered mental status, extremity weakness, paresthesias, involuntary movement, seizure and syncope.      Allergies  Aspirin; Codeine; and Penicillins  Home Medications   Prior to Admission medications   Medication Sig Start Date End Date Taking? Authorizing Provider  ibuprofen (ADVIL,MOTRIN) 200 MG tablet Take 200 mg by mouth every 6 (six) hours as needed for mild pain or moderate pain.   Yes Historical Provider, MD  simvastatin (ZOCOR) 20 MG tablet Take 20 mg by mouth daily at 6 PM.   Yes Historical Provider, MD  cyclobenzaprine (FLEXERIL) 10 MG tablet Take 1 tablet (10 mg total) by mouth 3 (three) times daily as needed for muscle spasms. Patient not taking: Reported on 11/26/2014 11/25/13   Charles B. Bernette MayersSheldon, MD  naproxen (NAPROSYN) 500 MG tablet Take 1 tablet (500 mg total) by mouth 2 (two) times daily. Patient not taking: Reported on 11/26/2014 11/25/13   Charles B. Bernette MayersSheldon, MD   BP 153/77 mmHg  Pulse 58  Temp(Src) 98.2 F (36.8 C) (Oral)  Resp 14  Ht 5\' 2"  (1.575 m)  Wt 155 lb (70.308 kg)  BMI 28.34 kg/m2  SpO2 100% Physical Exam  1520: Physical examination:  Nursing notes reviewed; Vital signs and O2 SAT reviewed;  Constitutional: Well developed, Well nourished, Well hydrated, In no acute distress; Head:  Normocephalic, atraumatic; Eyes: EOMI, PERRL, No scleral icterus; ENMT: Mouth and pharynx normal, Mucous membranes moist; Neck: Supple,  Full range of motion, No lymphadenopathy; Cardiovascular: Regular rate and rhythm, No murmur, rub, or gallop. Monitor with HR in 60's during exam.; Respiratory: Breath sounds clear & equal bilaterally, No rales, rhonchi, wheezes.  Speaking full sentences with ease, Normal respiratory effort/excursion; Chest: Nontender, Movement normal; Abdomen: Soft, Nontender, Nondistended, Normal bowel  sounds; Genitourinary: No CVA tenderness; Extremities: Pulses normal, No tenderness, No edema, No calf edema or asymmetry.; Neuro: AA&Ox3, Major CN grossly intact.  Speech clear. No gross focal motor or sensory deficits in extremities.; Skin: Color normal, Warm, Dry.; Psych:  Anxious, tearful at times.    ED Course  Procedures     EKG Interpretation   Date/Time:  Monday November 26 2014 15:33:24 EST Ventricular Rate:  57 PR Interval:  114 QRS Duration: 78 QT Interval:  693 QTC Calculation: 675 R Axis:   44 Text Interpretation:  Sinus rhythm Borderline short PR interval Abnormal  R-wave progression, early transition Borderline T wave abnormalities  Prolonged QT interval Baseline wander When compared with ECG of 02/04/2013  QT has lengthened Confirmed by [facility]  MD, Nicholos JohnsKATHLEEN [phone] on  11/26/2014 3:55:55 PM      MDM  MDM Reviewed: previous chart, nursing note and vitals Reviewed previous: labs and ECG Interpretation: labs, ECG and x-ray     Results for orders placed or performed during the hospital encounter of 11/26/14  CBC with Differential  Result Value Ref Range   WBC 6.6 4.0 - 10.5 K/uL   RBC 4.97 3.87 - 5.11 MIL/uL   Hemoglobin 14.6 12.0 - 15.0 g/dL   HCT 60.443.8 54.036.0 - 98.146.0 %   MCV 88.1 78.0 - 100.0 fL   MCH 29.4 26.0 - 34.0 pg   MCHC 33.3 30.0 - 36.0 g/dL   RDW 19.114.0 47.811.5 - 29.515.5 %   Platelets 310 150 - 400 K/uL   Neutrophils Relative % 57 43 - 77 %   Neutro Abs 3.7 1.7 - 7.7 K/uL   Lymphocytes Relative 33 12 - 46 %   Lymphs Abs 2.2 0.7 - 4.0 K/uL   Monocytes Relative 8 3 - 12 %   Monocytes Absolute 0.5 0.1 - 1.0 K/uL   Eosinophils Relative 2 0 - 5 %   Eosinophils Absolute 0.2 0.0 - 0.7 K/uL   Basophils Relative 0 0 - 1 %   Basophils Absolute 0.0 0.0 - 0.1 K/uL  Basic metabolic panel  Result Value Ref Range   Sodium 139 137 - 147 mEq/L   Potassium 3.4 (L) 3.7 - 5.3 mEq/L   Chloride 101 96 - 112 mEq/L   CO2 24 19 - 32 mEq/L   Glucose, Bld 105 (H) 70 -  99 mg/dL   BUN 8 6 - 23 mg/dL   Creatinine, Ser 6.210.83 0.50 - 1.10 mg/dL   Calcium 30.810.1 8.4 - 65.710.5 mg/dL   GFR calc non Af Amer 73 (L) >90 mL/min   GFR calc Af Amer 85 (L) >90 mL/min   Anion gap 14 5 - 15  Troponin I  Result Value Ref Range   Troponin I <0.30 <0.30 ng/mL  Magnesium  Result Value Ref Range   Magnesium 2.3 1.5 - 2.5 mg/dL   Dg Chest 2 View [id number]/629512/14/2015   CLINICAL DATA:  Cardiac palpitations. Chest pain. Shortness of breath. Cough.  EXAM: CHEST  2 VIEW  COMPARISON:  02/04/2013  FINDINGS: The heart size and mediastinal contours are within normal limits. Both lungs are clear. The visualized skeletal structures are unremarkable.  IMPRESSION: No active cardiopulmonary  disease.   Electronically Signed   By: Herbie Baltimore M.D.   On: 11/26/2014 16:02    1645:  Potassium repleted PO. Pt's HR on monitor remained in 60's during ED visit. Pt states she "feels ok" and wants to go home now. Encouraged to f/u with Cards MD for Holter monitor; verb understanding. Dx and testing d/w pt and family.  Questions answered.  Verb understanding, agreeable to d/c home with outpt f/u.   Samuel Jester, DO 11/28/14 1556

## 2014-11-26 NOTE — ED Notes (Signed)
Patient is resting comfortably. 

## 2014-11-26 NOTE — ED Notes (Signed)
Pt states flutter comes and goes, pt in tearful, state she lost her husband this year, pt lives alone. Pt has not followed up with cardiology.

## 2014-11-26 NOTE — ED Notes (Signed)
Intermittent heart fluttering for 3 days. No pain

## 2014-11-26 NOTE — ED Notes (Signed)
Patient given discharge instruction, verbalized understand. IV removed, band aid applied. Patient ambulatory out of the department.  

## 2014-12-24 ENCOUNTER — Other Ambulatory Visit (HOSPITAL_COMMUNITY): Payer: Self-pay | Admitting: Physician Assistant

## 2014-12-24 DIAGNOSIS — Z1231 Encounter for screening mammogram for malignant neoplasm of breast: Secondary | ICD-10-CM

## 2015-01-07 ENCOUNTER — Inpatient Hospital Stay (HOSPITAL_COMMUNITY): Admission: RE | Admit: 2015-01-07 | Payer: Self-pay | Source: Ambulatory Visit

## 2015-01-07 ENCOUNTER — Other Ambulatory Visit (HOSPITAL_COMMUNITY): Payer: Self-pay | Admitting: Physician Assistant

## 2015-01-07 DIAGNOSIS — Z1231 Encounter for screening mammogram for malignant neoplasm of breast: Secondary | ICD-10-CM

## 2015-01-21 ENCOUNTER — Ambulatory Visit (HOSPITAL_COMMUNITY): Payer: Self-pay

## 2015-03-06 ENCOUNTER — Ambulatory Visit (HOSPITAL_COMMUNITY): Payer: Self-pay

## 2015-05-23 ENCOUNTER — Ambulatory Visit (INDEPENDENT_AMBULATORY_CARE_PROVIDER_SITE_OTHER): Payer: Self-pay | Admitting: Cardiovascular Disease

## 2015-05-23 ENCOUNTER — Encounter: Payer: Self-pay | Admitting: Cardiovascular Disease

## 2015-05-23 ENCOUNTER — Telehealth: Payer: Self-pay | Admitting: Cardiovascular Disease

## 2015-05-23 VITALS — BP 116/62 | HR 54 | Ht 62.0 in | Wt 145.0 lb

## 2015-05-23 DIAGNOSIS — I493 Ventricular premature depolarization: Secondary | ICD-10-CM

## 2015-05-23 DIAGNOSIS — R002 Palpitations: Secondary | ICD-10-CM

## 2015-05-23 DIAGNOSIS — Z87898 Personal history of other specified conditions: Secondary | ICD-10-CM

## 2015-05-23 DIAGNOSIS — Z9289 Personal history of other medical treatment: Secondary | ICD-10-CM

## 2015-05-23 MED ORDER — AMIODARONE HCL 200 MG PO TABS: 100.0000 mg | ORAL_TABLET | Freq: Every day | ORAL | Status: DC

## 2015-05-23 MED ORDER — AMIODARONE HCL 100 MG PO TABS: 100.0000 mg | ORAL_TABLET | Freq: Every day | ORAL | Status: DC

## 2015-05-23 NOTE — Addendum Note (Signed)
Addended by: Marlyn Corporal A on: 05/23/2015 01:21 PM   Modules accepted: Orders

## 2015-05-23 NOTE — Patient Instructions (Addendum)
Your physician recommends that you schedule a follow-up appointment in: 1 month with Dr Purvis Sheffield  Your physician has requested that you have an echocardiogram. Echocardiography is a painless test that uses sound waves to create images of your heart. It provides your doctor with information about the size and shape of your heart and how well your heart's chambers and valves are working. This procedure takes approximately one hour. There are no restrictions for this procedure.    START Amiodarone 100 mg daily  Please get lab work :TSH,free T4,LFT's in 3 weeks,  06/13/2015    Thank you for choosing [facility] !

## 2015-05-23 NOTE — Progress Notes (Signed)
Patient ID: Molly Cook, female   DOB: [birth year], 64 y.o.   MRN: [id number]       CARDIOLOGY CONSULT NOTE  Patient ID: Molly Cook MRN: [id number] DOB/AGE: 08/22/1951 64 y.o.  Admit date: (Not on file) Primary Physician Newman Nip, NP  Reason for Consultation: palpitations  HPI: The patient is a 64 year old woman who is referred for evaluation of palpitations. She was evaluated at the [facility] ED on 03/31/15. Her EKG reportedly demonstrated sinus bradycardia with PVCs for which she was hospitalized overnight. Blood tests revealed normal CBC, normal electrolytes, normal troponin, and normal TSH. Chest x-ray showed no active disease. Her lowest heart rate was 48 bpm and her highest heart rate was 75 bpm. She had frequent unifocal PVCs. Cardiology was consulted there and did not recommend any medications or therapy at that time. Her symptoms were worse at night than during the day and occur intermittently. There were no associated symptoms such as chest pain, shortness of breath, or diaphoresis.  She was evaluated at the [facility] on 05/20/15. Lipid panel reportedly demonstrated total cholesterol 243 and LDL 161, triglycerides 74, HDL 67. She was encouraged to avoid marijuana use.   LDL had been 179 on 02/19/15 with total cholesterol 261. I reviewed all relevant labs and studies pertaining to her hospitalization as well as prior office appointments with her PCP.  ECG performed on 05/20/15 demonstrated sinus bradycardia, heart rate 54 bpm. PR interval was short.   An ECG performed on 02/18/15 demonstrated sinus bradycardia, heart rate 50 bpm, isolated PVC, and possible old septal infarct.  She has felt well today but when the palpitations occur she says they are particularly bothersome. Her heart rate is 54 bpm today. She denies syncope.   Soc: Widowed, husband died about one year ago. Married x 16 years. He worked at [employer] in [place], [place] for  several years. She has one son and 3 grandchildren. She quit smoking 20 years ago. She remains very active gardening and mowing her own lawn.    Allergies  Allergen Reactions  . Aspirin Other (See Comments)    Cannot take due to gastric issues  . Codeine Other (See Comments)    Made feel "funny"  . Penicillins Other (See Comments)    Unknown reaction-child hood reaction    Current Outpatient Prescriptions  Medication Sig Dispense Refill  . simvastatin (ZOCOR) 5 MG tablet Take 5 mg by mouth daily.     No current facility-administered medications for this visit.    Past Medical History  Diagnosis Date  . GERD (gastroesophageal reflux disease)   . Palpitations     No past surgical history on file.  History   Social History  . Marital Status: Widowed    Spouse Name: N/A  . Number of Children: N/A  . Years of Education: N/A   Occupational History  . Not on file.   Social History Main Topics  . Smoking status: Never Smoker   . Smokeless tobacco: Not on file  . Alcohol Use: No  . Drug Use: No  . Sexual Activity: Not on file   Other Topics Concern  . Not on file   Social History Narrative     No family history of premature CAD in 1st degree relatives.  Prior to Admission medications   Medication Sig Start Date End Date Taking? Authorizing Provider  cyclobenzaprine (FLEXERIL) 10 MG tablet Take 1 tablet (10 mg total) by mouth 3 (three) times  daily as needed for muscle spasms. Patient not taking: Reported on 11/26/2014 11/25/13   Susy Frizzle, MD  ibuprofen (ADVIL,MOTRIN) 200 MG tablet Take 200 mg by mouth every 6 (six) hours as needed for mild pain or moderate pain.    Historical Provider, MD  naproxen (NAPROSYN) 500 MG tablet Take 1 tablet (500 mg total) by mouth 2 (two) times daily. Patient not taking: Reported on 11/26/2014 11/25/13   Susy Frizzle, MD  simvastatin (ZOCOR) 20 MG tablet Take 20 mg by mouth daily at 6 PM.    Historical Provider, MD      Review of systems complete and found to be negative unless listed above in HPI     Physical exam Blood pressure 116/62, pulse 54, height  (1.575 m), weight 145 lb (65.772 kg), SpO2 98 %. General: NAD Neck: No JVD, no thyromegaly or thyroid nodule.  Lungs: Clear to auscultation bilaterally with normal respiratory effort. CV: Nondisplaced PMI. Regular rate and rhythm with frequent premature contractions, normal S1/S2, no S3/S4, no murmur.  No peripheral edema.  No carotid bruit.  Normal pedal pulses.  Abdomen: Soft, nontender, no hepatosplenomegaly, no distention.  Skin: Intact without lesions or rashes.  Neurologic: Alert and oriented x 3.  Psych: Normal affect. Extremities: No clubbing or cyanosis.  HEENT: Normal.   ECG: Most recent ECG reviewed.  Labs:   Lab Results  Component Value Date   WBC 6.6 11/26/2014   HGB 14.6 11/26/2014   HCT 43.8 11/26/2014   MCV 88.1 11/26/2014   PLT 310 11/26/2014   No results for input(s): NA, K, CL, CO2, BUN, CREATININE, CALCIUM, PROT, BILITOT, ALKPHOS, ALT, AST, GLUCOSE in the last 168 hours.  Invalid input(s): LABALBU Lab Results  Component Value Date   TROPONINI <0.30 11/26/2014   No results found for: CHOL No results found for: HDL No results found for: LDLCALC No results found for: TRIG No results found for: CHOLHDL No results found for: LDLDIRECT       Studies: No results found.  ASSESSMENT AND PLAN:  1. Palpitations with unifocal PVC's: Will obtain an echocardiogram to assess LV function. Will attempt low-dose amiodarone 100 mg daily and check TSH/free T4 and LFT's in 3 weeks.  2. Hyperlipidemia: Lipids noted above. Continue simvastatin 5 mg daily.  Dispo: f/u 1 month.   Signed: Prentice Docker, M.D., F.A.C.C.  05/23/2015, 10:15 AM

## 2015-05-23 NOTE — Telephone Encounter (Signed)
Patient states that she can not afford new medication that was prescribed for her today. / t

## 2015-05-23 NOTE — Telephone Encounter (Signed)
Faxed info to Blima Singer at [facility] [employer] for medication assistance

## 2015-05-27 ENCOUNTER — Ambulatory Visit (HOSPITAL_COMMUNITY): Payer: Self-pay

## 2015-06-06 ENCOUNTER — Encounter (HOSPITAL_COMMUNITY): Payer: Self-pay | Admitting: Emergency Medicine

## 2015-06-06 ENCOUNTER — Emergency Department (HOSPITAL_COMMUNITY)
Admission: EM | Admit: 2015-06-06 | Discharge: 2015-06-06 | Disposition: A | Discharge status: Home / Self Care | Payer: Self-pay | Attending: Emergency Medicine | Admitting: Emergency Medicine

## 2015-06-06 ENCOUNTER — Emergency Department (HOSPITAL_COMMUNITY): Payer: Self-pay

## 2015-06-06 DIAGNOSIS — R0600 Dyspnea, unspecified: Secondary | ICD-10-CM | POA: Insufficient documentation

## 2015-06-06 DIAGNOSIS — Z79899 Other long term (current) drug therapy: Secondary | ICD-10-CM | POA: Insufficient documentation

## 2015-06-06 DIAGNOSIS — Z880 Allergy status to penicillin: Secondary | ICD-10-CM | POA: Insufficient documentation

## 2015-06-06 DIAGNOSIS — Z8719 Personal history of other diseases of the digestive system: Secondary | ICD-10-CM | POA: Insufficient documentation

## 2015-06-06 NOTE — Discharge Instructions (Signed)
Tests showed no serious findings. Follow-up your primary care doctor or cardiologist.

## 2015-06-06 NOTE — ED Provider Notes (Signed)
CSN: [id number]     Arrival date & time 06/06/15  1809 History  This chart was scribed for Donnetta Hutching, MD by Annye Asa, ED Scribe. This patient was seen in room APA02/APA02 and the patient's care was started at 8:54 PM.    Chief Complaint  Patient presents with  . Shortness of Breath   The history is provided by the patient. No language interpreter was used.    HPI Comments: Molly Cook is a 64 y.o. female who presents to the Emergency Department complaining of  intermittent spells of dyspnea today, each lasting approx. 5 minutes. These are not associated with any activity or exertion. Her symptoms have resolved at present. Patient denies any chest pain, cough, fever, chills. Severity is mild. Nothing makes symptoms better or worse.  Patient is currently on amiodarone.  Past Medical History  Diagnosis Date  . GERD (gastroesophageal reflux disease)   . Palpitations    History reviewed. No pertinent past surgical history. No family history on file. History  Substance Use Topics  . Smoking status: Never Smoker   . Smokeless tobacco: Not on file  . Alcohol Use: No   OB History    No data available     Review of Systems  A complete 10 system review of systems was obtained and all systems are negative except as noted in the HPI and PMH.    Allergies  Aspirin; Codeine; and Penicillins  Home Medications   Prior to Admission medications   Medication Sig Start Date End Date Taking? Authorizing Provider  amiodarone (PACERONE) 200 MG tablet Take 0.5 tablets (100 mg total) by mouth daily. 05/23/15  Yes Laqueta Linden, MD  simvastatin (ZOCOR) 5 MG tablet Take 5 mg by mouth daily.   Yes Historical Provider, MD  traZODone (DESYREL) 50 MG tablet Take 50 mg by mouth at bedtime.   Yes Historical Provider, MD   BP 144/56 mmHg  Pulse 51  Temp(Src) 98.6 F (37 C) (Oral)  Resp 16  Ht 5\' 2"  (1.575 m)  Wt 147 lb (66.679 kg)  BMI 26.88 kg/m2  SpO2 100% Physical Exam   Constitutional: She is oriented to person, place, and time. She appears well-developed and well-nourished.  HENT:  Head: Normocephalic and atraumatic.  Eyes: Conjunctivae and EOM are normal. Pupils are equal, round, and reactive to light.  Neck: Normal range of motion. Neck supple.  Cardiovascular: Normal rate and regular rhythm.   Pulmonary/Chest: Effort normal and breath sounds normal.  Abdominal: Soft. Bowel sounds are normal.  Musculoskeletal: Normal range of motion.  Neurological: She is alert and oriented to person, place, and time.  Skin: Skin is warm and dry.  Psychiatric: She has a normal mood and affect. Her behavior is normal.  Nursing note and vitals reviewed.   ED Course  Procedures   DIAGNOSTIC STUDIES: Oxygen Saturation is 100% on RA, normal by my interpretation.    COORDINATION OF CARE: 8:56 PM Discussed treatment plan with pt at bedside, including ECG and CXR, and pt agreed to plan.  Labs Review Labs Reviewed - No data to display  Imaging Review Dg Chest 2 View  06/06/2015   CLINICAL DATA:  Shortness of breath and headache today.  EXAM: CHEST  2 VIEW  COMPARISON:  Single view of the chest 05/20/2015 and 03/31/2015.  FINDINGS: The lungs are clear. Heart size is normal. No pneumothorax or pleural effusion. No focal bony abnormality.  IMPRESSION: Negative chest.   Electronically Signed   By: Maisie Fus  Dalessio M.D.   On: 06/06/2015 20:12     EKG Interpretation   Date/Time:  Thursday June 06 2015 19:42:05 EDT Ventricular Rate:  50 PR Interval:  140 QRS Duration: 74 QT Interval:  478 QTC Calculation: 436 R Axis:   44 Text Interpretation:  Sinus rhythm Ventricular premature complex Confirmed  by Monea Pesantez  MD, Sumeet Geter ([id number]) on 06/06/2015 8:52:16 PM      MDM   Final diagnoses:  Dyspnea    A shows in no acute distress. EKG shows sinus bradycardia rate of 50 with one PVC which is congruent with other EKG's.  Chest x-ray negative. Patient has primary care and  cardiology follow-up.    I personally performed the services described in this documentation, which was scribed in my presence. The recorded information has been reviewed and is accurate.     Donnetta Hutching, MD 06/06/15 2115

## 2015-06-06 NOTE — ED Notes (Signed)
Onset today, SOB, cough with some green sputum, also complaining of headache

## 2015-06-18 ENCOUNTER — Telehealth: Payer: Self-pay | Admitting: Cardiovascular Disease

## 2015-06-18 ENCOUNTER — Ambulatory Visit (HOSPITAL_COMMUNITY)
Admission: RE | Admit: 2015-06-18 | Discharge: 2015-06-18 | Disposition: A | Discharge status: Home / Self Care | Payer: Medicare Other | Source: Ambulatory Visit | Attending: Cardiovascular Disease | Admitting: Cardiovascular Disease

## 2015-06-18 DIAGNOSIS — I493 Ventricular premature depolarization: Secondary | ICD-10-CM | POA: Insufficient documentation

## 2015-06-18 DIAGNOSIS — I083 Combined rheumatic disorders of mitral, aortic and tricuspid valves: Secondary | ICD-10-CM | POA: Diagnosis not present

## 2015-06-18 DIAGNOSIS — R002 Palpitations: Secondary | ICD-10-CM | POA: Insufficient documentation

## 2015-06-18 NOTE — Telephone Encounter (Signed)
Pt would like to know if she needs to continue taking the Pacerone

## 2015-06-18 NOTE — Telephone Encounter (Signed)
Dependent upon if she has had any symptom relief with amiodarone. HR was too low to try beta blockers or calcium channel blockers, and she was reportedly very symptomatic with PVC's when hospitalized.

## 2015-06-18 NOTE — Telephone Encounter (Signed)
Will forward to Dr. Koneswaran  

## 2015-06-18 NOTE — Telephone Encounter (Signed)
LM with EC son,as pt's number are non working

## 2015-06-18 NOTE — Telephone Encounter (Signed)
Patient has no sx's of PVC's at present ,states she is going to stop amiodarone and call back if sx's return

## 2015-06-19 ENCOUNTER — Other Ambulatory Visit: Payer: Self-pay

## 2015-06-19 DIAGNOSIS — E785 Hyperlipidemia, unspecified: Secondary | ICD-10-CM

## 2015-06-19 LAB — HEPATIC FUNCTION PANEL
ALT: 67 U/L — AB (ref 0–35)
AST: 40 U/L — AB (ref 0–37)
Albumin: 4.4 g/dL (ref 3.5–5.2)
Alkaline Phosphatase: 124 U/L — ABNORMAL HIGH (ref 39–117)
BILIRUBIN DIRECT: 0.1 mg/dL (ref 0.0–0.3)
BILIRUBIN INDIRECT: 0.2 mg/dL (ref 0.2–1.2)
BILIRUBIN TOTAL: 0.3 mg/dL (ref 0.2–1.2)
Total Protein: 7.4 g/dL (ref 6.0–8.3)

## 2015-06-19 LAB — TSH: TSH: 0.704 u[IU]/mL (ref 0.350–4.500)

## 2015-06-19 LAB — T4, FREE: Free T4: 1.15 ng/dL (ref 0.80–1.80)

## 2015-06-25 ENCOUNTER — Ambulatory Visit: Payer: Self-pay | Admitting: Cardiovascular Disease

## 2015-09-05 ENCOUNTER — Encounter (HOSPITAL_COMMUNITY): Payer: Self-pay | Admitting: *Deleted

## 2015-09-05 ENCOUNTER — Emergency Department (HOSPITAL_COMMUNITY)
Admission: EM | Admit: 2015-09-05 | Discharge: 2015-09-05 | Disposition: A | Discharge status: Home / Self Care | Payer: Medicare Other | Attending: Emergency Medicine | Admitting: Emergency Medicine

## 2015-09-05 DIAGNOSIS — R202 Paresthesia of skin: Secondary | ICD-10-CM | POA: Diagnosis present

## 2015-09-05 DIAGNOSIS — I4589 Other specified conduction disorders: Secondary | ICD-10-CM | POA: Diagnosis not present

## 2015-09-05 DIAGNOSIS — Z79899 Other long term (current) drug therapy: Secondary | ICD-10-CM | POA: Diagnosis not present

## 2015-09-05 DIAGNOSIS — E876 Hypokalemia: Secondary | ICD-10-CM | POA: Diagnosis not present

## 2015-09-05 DIAGNOSIS — R9431 Abnormal electrocardiogram [ECG] [EKG]: Secondary | ICD-10-CM

## 2015-09-05 DIAGNOSIS — R200 Anesthesia of skin: Secondary | ICD-10-CM | POA: Insufficient documentation

## 2015-09-05 DIAGNOSIS — Z8719 Personal history of other diseases of the digestive system: Secondary | ICD-10-CM | POA: Insufficient documentation

## 2015-09-05 DIAGNOSIS — Z880 Allergy status to penicillin: Secondary | ICD-10-CM | POA: Diagnosis not present

## 2015-09-05 DIAGNOSIS — R42 Dizziness and giddiness: Secondary | ICD-10-CM | POA: Diagnosis not present

## 2015-09-05 LAB — CBC WITH DIFFERENTIAL/PLATELET
BASOS ABS: 0 10*3/uL (ref 0.0–0.1)
Basophils Relative: 0 %
EOS ABS: 0.5 10*3/uL (ref 0.0–0.7)
EOS PCT: 7 %
HEMATOCRIT: 39.8 % (ref 36.0–46.0)
Hemoglobin: 13.3 g/dL (ref 12.0–15.0)
Lymphocytes Relative: 42 %
Lymphs Abs: 2.9 10*3/uL (ref 0.7–4.0)
MCH: 30.4 pg (ref 26.0–34.0)
MCHC: 33.4 g/dL (ref 30.0–36.0)
MCV: 91.1 fL (ref 78.0–100.0)
MONO ABS: 0.6 10*3/uL (ref 0.1–1.0)
MONOS PCT: 8 %
NEUTROS ABS: 3 10*3/uL (ref 1.7–7.7)
Neutrophils Relative %: 43 %
Platelets: 264 10*3/uL (ref 150–400)
RBC: 4.37 MIL/uL (ref 3.87–5.11)
RDW: 13.7 % (ref 11.5–15.5)
WBC: 7 10*3/uL (ref 4.0–10.5)

## 2015-09-05 LAB — COMPREHENSIVE METABOLIC PANEL
ALT: 33 U/L (ref 14–54)
ANION GAP: 8 (ref 5–15)
AST: 29 U/L (ref 15–41)
Albumin: 4.2 g/dL (ref 3.5–5.0)
Alkaline Phosphatase: 111 U/L (ref 38–126)
BILIRUBIN TOTAL: 0.6 mg/dL (ref 0.3–1.2)
BUN: 13 mg/dL (ref 6–20)
CHLORIDE: 105 mmol/L (ref 101–111)
CO2: 25 mmol/L (ref 22–32)
Calcium: 9.2 mg/dL (ref 8.9–10.3)
Creatinine, Ser: 0.86 mg/dL (ref 0.44–1.00)
GFR calc Af Amer: >60 mL/min (ref >60)
GFR calc non Af Amer: >60 mL/min (ref >60)
GLUCOSE: 111 mg/dL — AB (ref 65–99)
Potassium: 3.4 mmol/L — ABNORMAL LOW (ref 3.5–5.1)
Sodium: 138 mmol/L (ref 135–145)
TOTAL PROTEIN: 7.8 g/dL (ref 6.5–8.1)

## 2015-09-05 LAB — MAGNESIUM: MAGNESIUM: 2 mg/dL (ref 1.7–2.4)

## 2015-09-05 MED ORDER — POTASSIUM CHLORIDE 20 MEQ PO PACK
40.0000 meq | PACK | Freq: Once | ORAL | Status: AC
  Administered 2015-09-05: 40 meq via ORAL
  Filled 2015-09-05: qty 2

## 2015-09-05 MED ORDER — POTASSIUM CHLORIDE ER 10 MEQ PO TBCR: 20.0000 meq | EXTENDED_RELEASE_TABLET | Freq: Every day | ORAL | Status: DC

## 2015-09-05 NOTE — ED Notes (Signed)
Pt comes in with bilateral finger tingling and toes that started 2 days ago. Pt states this tingling is intermittent. Pt states she is having right shoulder pain that has been there month. NAD noted. Pt is alert and oriented with no deficits.

## 2015-09-05 NOTE — ED Notes (Signed)
Pt states understanding of care given and follow up instructions.  Ambulated from ED  

## 2015-09-05 NOTE — ED Notes (Signed)
Having lightheadedness, tingling in fingers and toes for last three days.  Currently not having any issues, just here to be checked out.

## 2015-09-05 NOTE — ED Provider Notes (Signed)
CSN: [id number]     Arrival date & time 09/05/15  1652 History   First MD Initiated Contact with Patient 09/05/15 1753     Chief Complaint  Patient presents with  . Tingling     (Consider location/radiation/quality/duration/timing/severity/associated sxs/prior Treatment) HPI Comments: 3 days of tingling right fingers and right toes, comes and goes Lasts about an hour, then improves Hasn't had it since this AM Feeling lightheaded No weakness, no diff walking/talking, no change vision, no vertigo Headache 2/10, started this AM as well, slow onset No n/v +hlpd No smoking, no fam hx of strokes, or early heart disease     Past Medical History  Diagnosis Date  . GERD (gastroesophageal reflux disease)   . Palpitations    History reviewed. No pertinent past surgical history. No family history on file. Social History  Substance Use Topics  . Smoking status: Never Smoker   . Smokeless tobacco: None  . Alcohol Use: No   OB History    No data available     Review of Systems  Unable to perform ROS Constitutional: Negative for fever.  HENT: Negative for sore throat.   Eyes: Negative for visual disturbance.  Respiratory: Negative for cough and shortness of breath.   Cardiovascular: Negative for chest pain.  Gastrointestinal: Negative for nausea, vomiting, abdominal pain, diarrhea, constipation and blood in stool.  Genitourinary: Negative for dysuria and difficulty urinating.  Musculoskeletal: Negative for back pain and neck pain.  Skin: Negative for rash.  Neurological: Positive for light-headedness and numbness (tingling intermittent right tips of fingers and toes). Negative for dizziness, tremors, syncope, facial asymmetry, speech difficulty, weakness and headaches.      Allergies  Aspirin; Codeine; and Penicillins  Home Medications   Prior to Admission medications   Medication Sig Start Date End Date Taking? Authorizing Provider  hydrOXYzine (ATARAX/VISTARIL) 10 MG  tablet Take 10 mg by mouth at bedtime.   Yes Historical Provider, MD  simvastatin (ZOCOR) 5 MG tablet Take 5 mg by mouth daily.   Yes Historical Provider, MD  traZODone (DESYREL) 50 MG tablet Take 12.5-50 mg by mouth at bedtime.    Yes Historical Provider, MD  amiodarone (PACERONE) 200 MG tablet Take 0.5 tablets (100 mg total) by mouth daily. Patient not taking: Reported on 09/05/2015 05/23/15   Laqueta Linden, MD  potassium chloride (K-DUR) 10 MEQ tablet Take 2 tablets (20 mEq total) by mouth daily. 09/05/15 09/09/15  Alvira Monday, MD   BP 136/77 mmHg  Pulse 63  Temp(Src) 98.1 F (36.7 C) (Oral)  Resp 18  SpO2 98% Physical Exam  Constitutional: She is oriented to person, place, and time. She appears well-developed and well-nourished. No distress.  HENT:  Head: Normocephalic and atraumatic.  Mouth/Throat: No oropharyngeal exudate.  Eyes: Conjunctivae and EOM are normal. Pupils are equal, round, and reactive to light.  Neck: Normal range of motion.  Cardiovascular: Normal rate, regular rhythm, normal heart sounds and intact distal pulses.  Exam reveals no gallop and no friction rub.   No murmur heard. Pulmonary/Chest: Effort normal and breath sounds normal. No respiratory distress. She has no wheezes. She has no rales.  Abdominal: Soft. She exhibits no distension. There is no tenderness. There is no guarding.  Musculoskeletal: She exhibits no edema or tenderness.  Neurological: She is alert and oriented to person, place, and time. She has normal strength. No cranial nerve deficit or sensory deficit. Coordination normal. GCS eye subscore is 4. GCS verbal subscore is 5. GCS motor subscore  is 6.  Skin: Skin is warm and dry. No rash noted. She is not diaphoretic. No erythema.  Nursing note and vitals reviewed.   ED Course  Procedures (including critical care time) Labs Review Labs Reviewed  COMPREHENSIVE METABOLIC PANEL - Abnormal; Notable for the following:    Potassium 3.4 (*)     Glucose, Bld 111 (*)    All other components within normal limits  CBC WITH DIFFERENTIAL/PLATELET  MAGNESIUM    Imaging Review No results found. I have personally reviewed and evaluated these images and lab results as part of my medical decision-making.   EKG Interpretation   Date/Time:  Thursday September 05 2015 19:50:47 EDT Ventricular Rate:  56 PR Interval:  122 QRS Duration: 74 QT Interval:  582 QTC Calculation: 561 R Axis:   54 Text Interpretation:  Prolonged QTc Sinus bradycardia Nonspecific T wave  abnormality Abnormal ECG No significant change since last tracing  Confirmed by [facility] MD, ERIN ([id number]) on 09/05/2015 7:56:01 PM      MDM   Final diagnoses:  Tingling in extremities  Lightheadedness  Prolonged QT interval   64yo female with history of hyperlipidemia presents with concern for 3 days of intermittent tingling in the very tips of her right fingers and toes and lightheadedness.  Patient without other neurologic symptoms, with normal neurologic exam, and given distribution of symptoms just in tips of fingers/toes and intermittent nature with no other neurologic deficits have low suspicion symptoms represent stroke or TIA.  Pt with mild hypokalemia, and given potassium replacement.  Pt also reports lightheadedness. Denies black/bloody stool, has normal hemoglobin and normal electrolytes with exception of mild hypokalemia.  EKG done and evaluated by me does show prolonged QTc of 561.  On most recent EKGs, pt did not have this, however this is seen on other previous EKGs.  Pt no longer takes amiodarone, and it is unclear if any of her medications are contributing to this.  Trazodone has been reported to prolong QTc, and recommended tapering dose and having EKG rechecked by PCP. Pt was to start hydroxyzine for sleep this evening, however given prolonged QTc advised she not begin taking this medication.  Recommended pt have recheck EKG with PCP.  Potassium replacement  given as possible contributor to pt prolonged QTc as well. Patient discharged in stable condition with understanding of reasons to return.      Alvira Monday, MD 09/06/15 1323

## 2017-10-29 ENCOUNTER — Encounter (HOSPITAL_COMMUNITY): Payer: Self-pay | Admitting: Emergency Medicine

## 2017-10-29 ENCOUNTER — Emergency Department (HOSPITAL_COMMUNITY): Payer: Medicare Other

## 2017-10-29 ENCOUNTER — Other Ambulatory Visit: Payer: Self-pay

## 2017-10-29 ENCOUNTER — Emergency Department (HOSPITAL_COMMUNITY)
Admission: EM | Admit: 2017-10-29 | Discharge: 2017-10-29 | Disposition: A | Discharge status: Home / Self Care | Payer: Medicare Other | Attending: Emergency Medicine | Admitting: Emergency Medicine

## 2017-10-29 DIAGNOSIS — R110 Nausea: Secondary | ICD-10-CM | POA: Insufficient documentation

## 2017-10-29 DIAGNOSIS — R509 Fever, unspecified: Secondary | ICD-10-CM | POA: Insufficient documentation

## 2017-10-29 DIAGNOSIS — Z79899 Other long term (current) drug therapy: Secondary | ICD-10-CM | POA: Insufficient documentation

## 2017-10-29 LAB — COMPREHENSIVE METABOLIC PANEL
ALBUMIN: 4.5 g/dL (ref 3.5–5.0)
ALT: 25 U/L (ref 14–54)
ANION GAP: 9 (ref 5–15)
AST: 31 U/L (ref 15–41)
Alkaline Phosphatase: 100 U/L (ref 38–126)
BUN: 10 mg/dL (ref 6–20)
CHLORIDE: 103 mmol/L (ref 101–111)
CO2: 27 mmol/L (ref 22–32)
Calcium: 9.9 mg/dL (ref 8.9–10.3)
Creatinine, Ser: 0.71 mg/dL (ref 0.44–1.00)
GFR calc Af Amer: >60 mL/min (ref >60)
GFR calc non Af Amer: >60 mL/min (ref >60)
GLUCOSE: 94 mg/dL (ref 65–99)
POTASSIUM: 4.3 mmol/L (ref 3.5–5.1)
SODIUM: 139 mmol/L (ref 135–145)
Total Bilirubin: 0.7 mg/dL (ref 0.3–1.2)
Total Protein: 8 g/dL (ref 6.5–8.1)

## 2017-10-29 LAB — CBC WITH DIFFERENTIAL/PLATELET
BASOS PCT: 0 %
Basophils Absolute: 0 10*3/uL (ref 0.0–0.1)
EOS ABS: 0.1 10*3/uL (ref 0.0–0.7)
Eosinophils Relative: 1 %
HCT: 43.3 % (ref 36.0–46.0)
HEMOGLOBIN: 14.3 g/dL (ref 12.0–15.0)
Lymphocytes Relative: 37 %
Lymphs Abs: 2 10*3/uL (ref 0.7–4.0)
MCH: 30.2 pg (ref 26.0–34.0)
MCHC: 33 g/dL (ref 30.0–36.0)
MCV: 91.4 fL (ref 78.0–100.0)
Monocytes Absolute: 0.4 10*3/uL (ref 0.1–1.0)
Monocytes Relative: 8 %
NEUTROS PCT: 54 %
Neutro Abs: 2.9 10*3/uL (ref 1.7–7.7)
Platelets: 209 10*3/uL (ref 150–400)
RBC: 4.74 MIL/uL (ref 3.87–5.11)
RDW: 14.3 % (ref 11.5–15.5)
WBC: 5.4 10*3/uL (ref 4.0–10.5)

## 2017-10-29 LAB — URINALYSIS, ROUTINE W REFLEX MICROSCOPIC
Bilirubin Urine: NEGATIVE
Glucose, UA: NEGATIVE mg/dL
Hgb urine dipstick: NEGATIVE
Ketones, ur: 5 mg/dL — AB
Leukocytes, UA: NEGATIVE
NITRITE: NEGATIVE
Protein, ur: NEGATIVE mg/dL
SPECIFIC GRAVITY, URINE: 1.014 (ref 1.005–1.030)
pH: 6 (ref 5.0–8.0)

## 2017-10-29 LAB — LACTIC ACID, PLASMA
LACTIC ACID, VENOUS: 2.2 mmol/L — AB (ref 0.5–1.9)
LACTIC ACID, VENOUS: 1 mmol/L (ref 0.5–1.9)

## 2017-10-29 LAB — TROPONIN I: Troponin I: <0.03 ng/mL (ref <0.03)

## 2017-10-29 LAB — LIPASE, BLOOD: Lipase: 34 U/L (ref 11–51)

## 2017-10-29 MED ORDER — ONDANSETRON 4 MG PO TBDP: 4.0000 mg | ORAL_TABLET | Freq: Three times a day (TID) | ORAL | 0 refills | Status: DC | PRN

## 2017-10-29 MED ORDER — SODIUM CHLORIDE 0.9 % IV BOLUS (SEPSIS)
1000.0000 mL | Freq: Once | INTRAVENOUS | Status: AC
  Administered 2017-10-29: 1000 mL via INTRAVENOUS

## 2017-10-29 NOTE — ED Notes (Signed)
Lactic acid 2.2, primary nurse notified.

## 2017-10-29 NOTE — ED Provider Notes (Signed)
[facility]ERGENCY DEPARTMENT Provider Note   CSN: [id number] Arrival date & time: 10/29/17  1029     History   Chief Complaint Chief Complaint  Patient presents with  . Nausea    HPI Molly Cook is a 66 y.o. female.  HPI  Pt was seen at 1100. Per pt, c/o gradual onset and persistence of constant nausea x1 week. Pt states she smokes marijuana nightly "in order to get to sleep." Pt also states for the past 4 days she has had "congestion" and hot/cold chills. Denies sore throat, no cough/SOB, no CP/palpitations, no abd pain, no vomiting/diarrhea, no neck or back pain, no objective fever, no rash, no focal motor weakness, no tingling/numbness in extremities.    Past Medical History:  Diagnosis Date  . GERD (gastroesophageal reflux disease)   . Palpitations     There are no active problems to display for this patient.   History reviewed. No pertinent surgical history.  OB History    No data available       Home Medications    Prior to Admission medications   Medication Sig Start Date End Date Taking? Authorizing Provider  amiodarone (PACERONE) 200 MG tablet Take 0.5 tablets (100 mg total) by mouth daily. Patient not taking: Reported on 09/05/2015 05/23/15   Laqueta LindenKoneswaran, Suresh A, MD  hydrOXYzine (ATARAX/VISTARIL) 10 MG tablet Take 10 mg by mouth at bedtime.    [provider]  potassium chloride (K-DUR) 10 MEQ tablet Take 2 tablets (20 mEq total) by mouth daily. 09/05/15 09/09/15  Alvira MondaySchlossman, Erin, MD  simvastatin (ZOCOR) 5 MG tablet Take 5 mg by mouth daily.    [provider]  traZODone (DESYREL) 50 MG tablet Take 12.5-50 mg by mouth at bedtime.     [provider]    Family History History reviewed. No pertinent family history.  Social History Social History   Tobacco Use  . Smoking status: Never Smoker  . Smokeless tobacco: Never Used  Substance Use Topics  . Alcohol use: No    Alcohol/week: 0.0 oz  . Drug use: Yes    Types:  Marijuana    Comment: last use 10/28/17     Allergies   Aspirin; Codeine; and Penicillins   Review of Systems Review of Systems ROS: Statement: All systems negative except as marked or noted in the HPI; Constitutional: Negative for fever and +hot/cold chills, "congestion." ; ; Eyes: Negative for eye pain, redness and discharge. ; ; ENMT: Negative for ear pain, hoarseness, nasal congestion, sinus pressure and sore throat. ; ; Cardiovascular: Negative for chest pain, palpitations, diaphoresis, dyspnea and peripheral edema. ; ; Respiratory: Negative for cough, wheezing and stridor. ; ; Gastrointestinal: +nausea. Negative for vomiting, diarrhea, abdominal pain, blood in stool, hematemesis, jaundice and rectal bleeding. . ; ; Genitourinary: Negative for dysuria, flank pain and hematuria. ; ; Musculoskeletal: Negative for back pain and neck pain. Negative for swelling and trauma.; ; Skin: Negative for pruritus, rash, abrasions, blisters, bruising and skin lesion.; ; Neuro: Negative for headache, lightheadedness and neck stiffness. Negative for weakness, altered level of consciousness, altered mental status, extremity weakness, paresthesias, involuntary movement, seizure and syncope.       Physical Exam Updated Vital Signs BP (!) 158/64 (BP Location: Left Arm)   Pulse (!) 58   Temp 98 F (36.7 C) (Oral)   Resp 18   Ht 5\' 2"  (1.575 m)   Wt 71.7 kg (158 lb)   SpO2 99%   BMI 28.90 kg/m  BP 138/64   Pulse (!) 57   Temp 98 F (36.7 C) (Oral)   Resp 17   Ht 5\' 2"  (1.575 m)   Wt 71.7 kg (158 lb)   SpO2 98%   BMI 28.90 kg/m    11:58:57 Orthostatic Vital Signs JC  Orthostatic Lying   BP- Lying: 168/71  Pulse- Lying: 57      Orthostatic Sitting  BP- Sitting: 158/90  Pulse- Sitting: 62      Orthostatic Standing at 0 minutes  BP- Standing at 0 minutes: 182/75  Pulse- Standing at 0 minutes: 58     Physical Exam 1105: Physical examination:  Nursing notes reviewed; Vital signs  and O2 SAT reviewed;  Constitutional: Well developed, Well nourished, In no acute distress; Head:  Normocephalic, atraumatic; Eyes: EOMI, PERRL, No scleral icterus; ENMT: TM's clear bilat. +edemetous nasal turbinates bilat with clear rhinorrhea. Mouth and pharynx without lesions. No tonsillar exudates. No intra-oral edema. No submandibular or sublingual edema. No hoarse voice, no drooling, no stridor. No trismus. Mouth and pharynx normal, Mucous membranes dry; Neck: Supple, Full range of motion, No lymphadenopathy; Cardiovascular: Regular rate and rhythm, No gallop; Respiratory: Breath sounds clear & equal bilaterally, No wheezes.  Speaking full sentences with ease, Normal respiratory effort/excursion; Chest: Nontender, Movement normal; Abdomen: Soft, Nontender, Nondistended, Normal bowel sounds; Genitourinary: No CVA tenderness; Extremities: Pulses normal, No tenderness, No edema, No calf edema or asymmetry.; Neuro: AA&Ox3, Major CN grossly intact. No facial droop. Speech clear. No gross focal motor or sensory deficits in extremities.; Skin: Color normal, Warm, Dry.   ED Treatments / Results  Labs (all labs ordered are listed, but only abnormal results are displayed)   EKG  EKG Interpretation  Date/Time:  Friday October 29 2017 10:43:57 EST Ventricular Rate:  56 PR Interval:    QRS Duration: 93 QT Interval:  482 QTC Calculation: 466 R Axis:   47 Text Interpretation:  Sinus rhythm Borderline short PR interval Abnormal R-wave progression, early transition When compared with ECG of 09/05/2015 QT has shortened Otherwise no significant change Confirmed by Samuel Jester [phone]) on 10/29/2017 11:39:45 AM       Radiology   Procedures Procedures (including critical care time)  Medications Ordered in ED Medications  sodium chloride 0.9 % bolus 1,000 mL (1,000 mLs Intravenous New Bag/Given 10/29/17 1158)     Initial Impression / Assessment and Plan / ED Course  I have reviewed the  triage vital signs and the nursing notes.  Pertinent labs & imaging results that were available during my care of the patient were reviewed by me and considered in my medical decision making (see chart for details).  MDM Reviewed: previous chart, nursing note and vitals Reviewed previous: labs and ECG Interpretation: labs, ECG and x-ray   Results for orders placed or performed during the hospital encounter of 10/29/17  Comprehensive metabolic panel  Result Value Ref Range   Sodium 139 135 - 145 mmol/L   Potassium 4.3 3.5 - 5.1 mmol/L   Chloride 103 101 - 111 mmol/L   CO2 27 22 - 32 mmol/L   Glucose, Bld 94 65 - 99 mg/dL   BUN 10 6 - 20 mg/dL   Creatinine, Ser 5.36 0.44 - 1.00 mg/dL   Calcium 9.9 8.9 - 64.4 mg/dL   Total Protein 8.0 6.5 - 8.1 g/dL   Albumin 4.5 3.5 - 5.0 g/dL   AST 31 15 - 41 U/L   ALT 25 14 - 54 U/L   Alkaline Phosphatase  100 38 - 126 U/L   Total Bilirubin 0.7 0.3 - 1.2 mg/dL   GFR calc non Af Amer >60 >60 mL/min   GFR calc Af Amer >60 >60 mL/min   Anion gap 9 5 - 15  Lipase, blood  Result Value Ref Range   Lipase 34 11 - 51 U/L  Troponin I  Result Value Ref Range   Troponin I <0.03 <0.03 ng/mL  Lactic acid, plasma  Result Value Ref Range   Lactic Acid, Venous 2.2 (HH) 0.5 - 1.9 mmol/L  Lactic acid, plasma  Result Value Ref Range   Lactic Acid, Venous 1.0 0.5 - 1.9 mmol/L  CBC with Differential  Result Value Ref Range   WBC 5.4 4.0 - 10.5 K/uL   RBC 4.74 3.87 - 5.11 MIL/uL   Hemoglobin 14.3 12.0 - 15.0 g/dL   HCT 16.143.3 09.636.0 - 04.546.0 %   MCV 91.4 78.0 - 100.0 fL   MCH 30.2 26.0 - 34.0 pg   MCHC 33.0 30.0 - 36.0 g/dL   RDW 40.914.3 81.111.5 - 91.415.5 %   Platelets 209 150 - 400 K/uL   Neutrophils Relative % 54 %   Neutro Abs 2.9 1.7 - 7.7 K/uL   Lymphocytes Relative 37 %   Lymphs Abs 2.0 0.7 - 4.0 K/uL   Monocytes Relative 8 %   Monocytes Absolute 0.4 0.1 - 1.0 K/uL   Eosinophils Relative 1 %   Eosinophils Absolute 0.1 0.0 - 0.7 K/uL   Basophils Relative  0 %   Basophils Absolute 0.0 0.0 - 0.1 K/uL  Urinalysis, Routine w reflex microscopic  Result Value Ref Range   Color, Urine YELLOW YELLOW   APPearance CLEAR CLEAR   Specific Gravity, Urine 1.014 1.005 - 1.030   pH 6.0 5.0 - 8.0   Glucose, UA NEGATIVE NEGATIVE mg/dL   Hgb urine dipstick NEGATIVE NEGATIVE   Bilirubin Urine NEGATIVE NEGATIVE   Ketones, ur 5 (A) NEGATIVE mg/dL   Protein, ur NEGATIVE NEGATIVE mg/dL   Nitrite NEGATIVE NEGATIVE   Leukocytes, UA NEGATIVE NEGATIVE   Dg Abd Acute W/chest Result Date: 10/29/2017 CLINICAL DATA:  Nausea.  Congestion. EXAM: DG ABDOMEN ACUTE W/ 1V CHEST COMPARISON:  06/06/2015 . FINDINGS: Mediastinum and hilar structures normal. Lungs are clear. Heart size normal. No pleural effusion or pneumothorax. No acute bony abnormality . Soft tissue structures are unremarkable. No bowel distention no free air. IMPRESSION: 1. No acute cardiopulmonary disease. 2. No acute intra-abdominal abnormality. Electronically Signed   By: Maisie Fushomas  Register   On: 10/29/2017 12:35    1450:  Pt has tol PO well while in the ED without N/V.  No stooling while in the ED.  Abd remains benign, resps easy, VSS. Feels better after IVF and wants to go home now. Tx symptomatically at this time. Dx and testing d/w pt and family.  Questions answered.  Verb understanding, agreeable to d/c home with outpt f/u.     Final Clinical Impressions(s) / ED Diagnoses   Final diagnoses:  None    ED Discharge Orders    None       Samuel JesterMcManus, Cyrena Kuchenbecker, DO 11/01/17 2054

## 2017-10-29 NOTE — Discharge Instructions (Signed)
Take the prescription as directed.  Increase your fluid intake (ie:  Gatoraide) for the next few days.  Eat a bland diet and advance to your regular diet slowly as you can tolerate it. Call your regular medical doctor today to schedule a follow up appointment next week.  Return to the Emergency Department immediately sooner if worsening.

## 2017-10-29 NOTE — ED Triage Notes (Signed)
Pt reports nausea and chills x1 week. Pt denies pain, shortness of breath, chest pain,v/d.

## 2017-10-31 LAB — URINE CULTURE: Culture: <10000 — AB

## 2019-01-21 ENCOUNTER — Emergency Department (HOSPITAL_COMMUNITY): Payer: Medicare PPO

## 2019-01-21 ENCOUNTER — Encounter (HOSPITAL_COMMUNITY): Payer: Self-pay

## 2019-01-21 ENCOUNTER — Other Ambulatory Visit: Payer: Self-pay

## 2019-01-21 ENCOUNTER — Emergency Department (HOSPITAL_COMMUNITY)
Admission: EM | Admit: 2019-01-21 | Discharge: 2019-01-21 | Disposition: A | Discharge status: Home / Self Care | Payer: Medicare PPO | Attending: Emergency Medicine | Admitting: Emergency Medicine

## 2019-01-21 DIAGNOSIS — Z79899 Other long term (current) drug therapy: Secondary | ICD-10-CM | POA: Diagnosis not present

## 2019-01-21 DIAGNOSIS — I10 Essential (primary) hypertension: Secondary | ICD-10-CM | POA: Insufficient documentation

## 2019-01-21 DIAGNOSIS — R531 Weakness: Secondary | ICD-10-CM | POA: Insufficient documentation

## 2019-01-21 HISTORY — DX: Essential (primary) hypertension: I10

## 2019-01-21 LAB — URINALYSIS, ROUTINE W REFLEX MICROSCOPIC
BILIRUBIN URINE: NEGATIVE
Glucose, UA: NEGATIVE mg/dL
Hgb urine dipstick: NEGATIVE
KETONES UR: NEGATIVE mg/dL
LEUKOCYTES UA: NEGATIVE
NITRITE: NEGATIVE
PROTEIN: NEGATIVE mg/dL
Specific Gravity, Urine: >1.03 — ABNORMAL HIGH (ref 1.005–1.030)
pH: 5.5 (ref 5.0–8.0)

## 2019-01-21 LAB — HEPATIC FUNCTION PANEL
ALT: 29 U/L (ref 0–44)
AST: 25 U/L (ref 15–41)
Albumin: 4.3 g/dL (ref 3.5–5.0)
Alkaline Phosphatase: 127 U/L — ABNORMAL HIGH (ref 38–126)
Bilirubin, Direct: 0.1 mg/dL (ref 0.0–0.2)
Indirect Bilirubin: 0.3 mg/dL (ref 0.3–0.9)
Total Bilirubin: 0.4 mg/dL (ref 0.3–1.2)
Total Protein: 7.8 g/dL (ref 6.5–8.1)

## 2019-01-21 LAB — BASIC METABOLIC PANEL
Anion gap: 7 (ref 5–15)
BUN: 10 mg/dL (ref 8–23)
CALCIUM: 9 mg/dL (ref 8.9–10.3)
CHLORIDE: 106 mmol/L (ref 98–111)
CO2: 24 mmol/L (ref 22–32)
CREATININE: 0.84 mg/dL (ref 0.44–1.00)
Glucose, Bld: 89 mg/dL (ref 70–99)
Potassium: 3.8 mmol/L (ref 3.5–5.1)
SODIUM: 137 mmol/L (ref 135–145)

## 2019-01-21 LAB — CBC
HEMATOCRIT: 41.8 % (ref 36.0–46.0)
Hemoglobin: 13 g/dL (ref 12.0–15.0)
MCH: 28.4 pg (ref 26.0–34.0)
MCHC: 31.1 g/dL (ref 30.0–36.0)
MCV: 91.5 fL (ref 80.0–100.0)
NRBC: 0 % (ref 0.0–0.2)
PLATELETS: 274 10*3/uL (ref 150–400)
RBC: 4.57 MIL/uL (ref 3.87–5.11)
RDW: 14.3 % (ref 11.5–15.5)
WBC: 5.9 10*3/uL (ref 4.0–10.5)

## 2019-01-21 NOTE — ED Triage Notes (Addendum)
Pt reports she wakes up every morning weak for the past 2 weeks. Pt reports that she went to [facility] and treated for UTI and completed antibiotics. Still remains weak. Denies cp/or SOB. Pt reports palpitations this am

## 2019-01-21 NOTE — ED Provider Notes (Signed)
[facility] EMERGENCY DEPARTMENT Provider Note   CSN: [id number] Arrival date & time: 01/21/19  2411     History   Chief Complaint Chief Complaint  Patient presents with  . Weakness    HPI ARRYN Cook is a 68 y.o. female.  Patient states that in the morning she feels weak.  This been going on for couple weeks.  She was seen at [facility] and was diagnosed with a UTI was put on antibiotics.  She has finished that treatment now.  But she still feels weak  The history is provided by the patient. No language interpreter was used.  Weakness  Severity:  Mild Onset quality:  Gradual Duration:  14 days Timing:  Constant Progression:  Waxing and waning Chronicity:  New Context: not alcohol use   Relieved by:  Nothing Worsened by:  Nothing Ineffective treatments:  None tried Associated symptoms: no abdominal pain, no chest pain, no cough, no diarrhea, no frequency, no headaches and no seizures   Risk factors: no anemia     Past Medical History:  Diagnosis Date  . GERD (gastroesophageal reflux disease)   . Hypertension   . Palpitations     There are no active problems to display for this patient.   History reviewed. No pertinent surgical history.   OB History   No obstetric history on file.      Home Medications    Prior to Admission medications   Medication Sig Start Date End Date Taking? Authorizing Provider  amLODipine (NORVASC) 5 MG tablet Take 5 mg by mouth daily.   Yes [provider]  cetirizine (ZYRTEC) 10 MG tablet Take 10 mg by mouth daily.   Yes [provider]  Omega-3 Fatty Acids (FISH OIL PO) Take 1 capsule by mouth daily.   Yes [provider]  sulfamethoxazole-trimethoprim (BACTRIM DS,SEPTRA DS) 800-160 MG tablet Take 1 tablet by mouth 2 (two) times daily. 01/11/19  Yes [provider]    Family History No family history on file.  Social History Social History   Tobacco Use  . Smoking status:  Never Smoker  . Smokeless tobacco: Never Used  Substance Use Topics  . Alcohol use: No    Alcohol/week: 0.0 standard drinks  . Drug use: Not Currently    Types: Marijuana    Comment: last use 10/28/17     Allergies   Aspirin; Codeine; and Penicillins   Review of Systems Review of Systems  Constitutional: Negative for appetite change and fatigue.  HENT: Negative for congestion, ear discharge and sinus pressure.   Eyes: Negative for discharge.  Respiratory: Negative for cough.   Cardiovascular: Negative for chest pain.  Gastrointestinal: Negative for abdominal pain and diarrhea.  Genitourinary: Negative for frequency and hematuria.  Musculoskeletal: Negative for back pain.  Skin: Negative for rash.  Neurological: Positive for weakness. Negative for seizures and headaches.  Psychiatric/Behavioral: Negative for hallucinations.     Physical Exam Updated Vital Signs BP 128/62   Pulse 62   Temp 98 F (36.7 C) (Oral)   Resp 18   Ht 5\' 2"  (1.575 m)   Wt 71.7 kg   SpO2 98%   BMI 28.90 kg/m   Physical Exam Vitals signs and nursing note reviewed.  Constitutional:      Appearance: She is well-developed.  HENT:     Head: Normocephalic.     Nose: Nose normal.  Eyes:     General: No scleral icterus.    Conjunctiva/sclera: Conjunctivae normal.  Neck:     Musculoskeletal: Neck supple.     Thyroid: No thyromegaly.  Cardiovascular:     Rate and Rhythm: Normal rate and regular rhythm.     Heart sounds: No murmur. No friction rub. No gallop.   Pulmonary:     Breath sounds: No stridor. No wheezing or rales.  Chest:     Chest wall: No tenderness.  Abdominal:     General: There is no distension.     Tenderness: There is no abdominal tenderness. There is no rebound.  Musculoskeletal: Normal range of motion.  Lymphadenopathy:     Cervical: No cervical adenopathy.  Skin:    Findings: No erythema or rash.  Neurological:     Mental Status: She is oriented to person, place,  and time.     Motor: No abnormal muscle tone.     Coordination: Coordination normal.  Psychiatric:        Behavior: Behavior normal.      ED Treatments / Results  Labs (all labs ordered are listed, but only abnormal results are displayed) Labs Reviewed  URINALYSIS, ROUTINE W REFLEX MICROSCOPIC - Abnormal; Notable for the following components:      Result Value   Specific Gravity, Urine >1.030 (*)    All other components within normal limits  HEPATIC FUNCTION PANEL - Abnormal; Notable for the following components:   Alkaline Phosphatase 127 (*)    All other components within normal limits  BASIC METABOLIC PANEL  CBC    EKG None  Radiology Dg Chest 2 View  Result Date: 01/21/2019 CLINICAL DATA:  Weakness EXAM: CHEST - 2 VIEW COMPARISON:  Chest radiograph 02/24/2018 FINDINGS: Monitoring leads overlie the patient. Normal cardiac and mediastinal contours. No consolidative pulmonary opacities. No pleural effusion or pneumothorax. Thoracic spine degenerative changes. IMPRESSION: No acute cardiopulmonary process. Electronically Signed   By: Annia Belt M.D.   On: 01/21/2019 14:44    Procedures Procedures (including critical care time)  Medications Ordered in ED Medications - No data to display   Initial Impression / Assessment and Plan / ED Course  I have reviewed the triage vital signs and the nursing notes.  Pertinent labs & imaging results that were available during my care of the patient were reviewed by me and considered in my medical decision making (see chart for details).     Patient with weakness.  Labs unremarkable except for urinalysis shows some dehydration.  She also has nonspecific changes on her EKG.  Patient is referred back to her family doctor for further evaluation  Final Clinical Impressions(s) / ED Diagnoses   Final diagnoses:  Weakness    ED Discharge Orders    None       Bethann Berkshire, MD 01/21/19 1534

## 2019-01-21 NOTE — ED Notes (Signed)
unsuccessful IV start  Blood drawn but could not advance catheter

## 2019-01-21 NOTE — Discharge Instructions (Addendum)
Take 1 multivitamin a day.  Follow-up with your family doctor as planned

## 2019-01-21 NOTE — ED Notes (Signed)
Pt seen this week at [facility] ED States she has moved here from [place]  Was told at [facility] that she had a slight UTI

## 2019-06-14 IMAGING — CR DG CHEST 2V
2 series · 2 of 2 positions shown · non-contrast
Comparison: Chest radiograph 02/24/2018

CLINICAL DATA: Weakness

EXAM:
CHEST - 2 VIEW

[w chest lat]
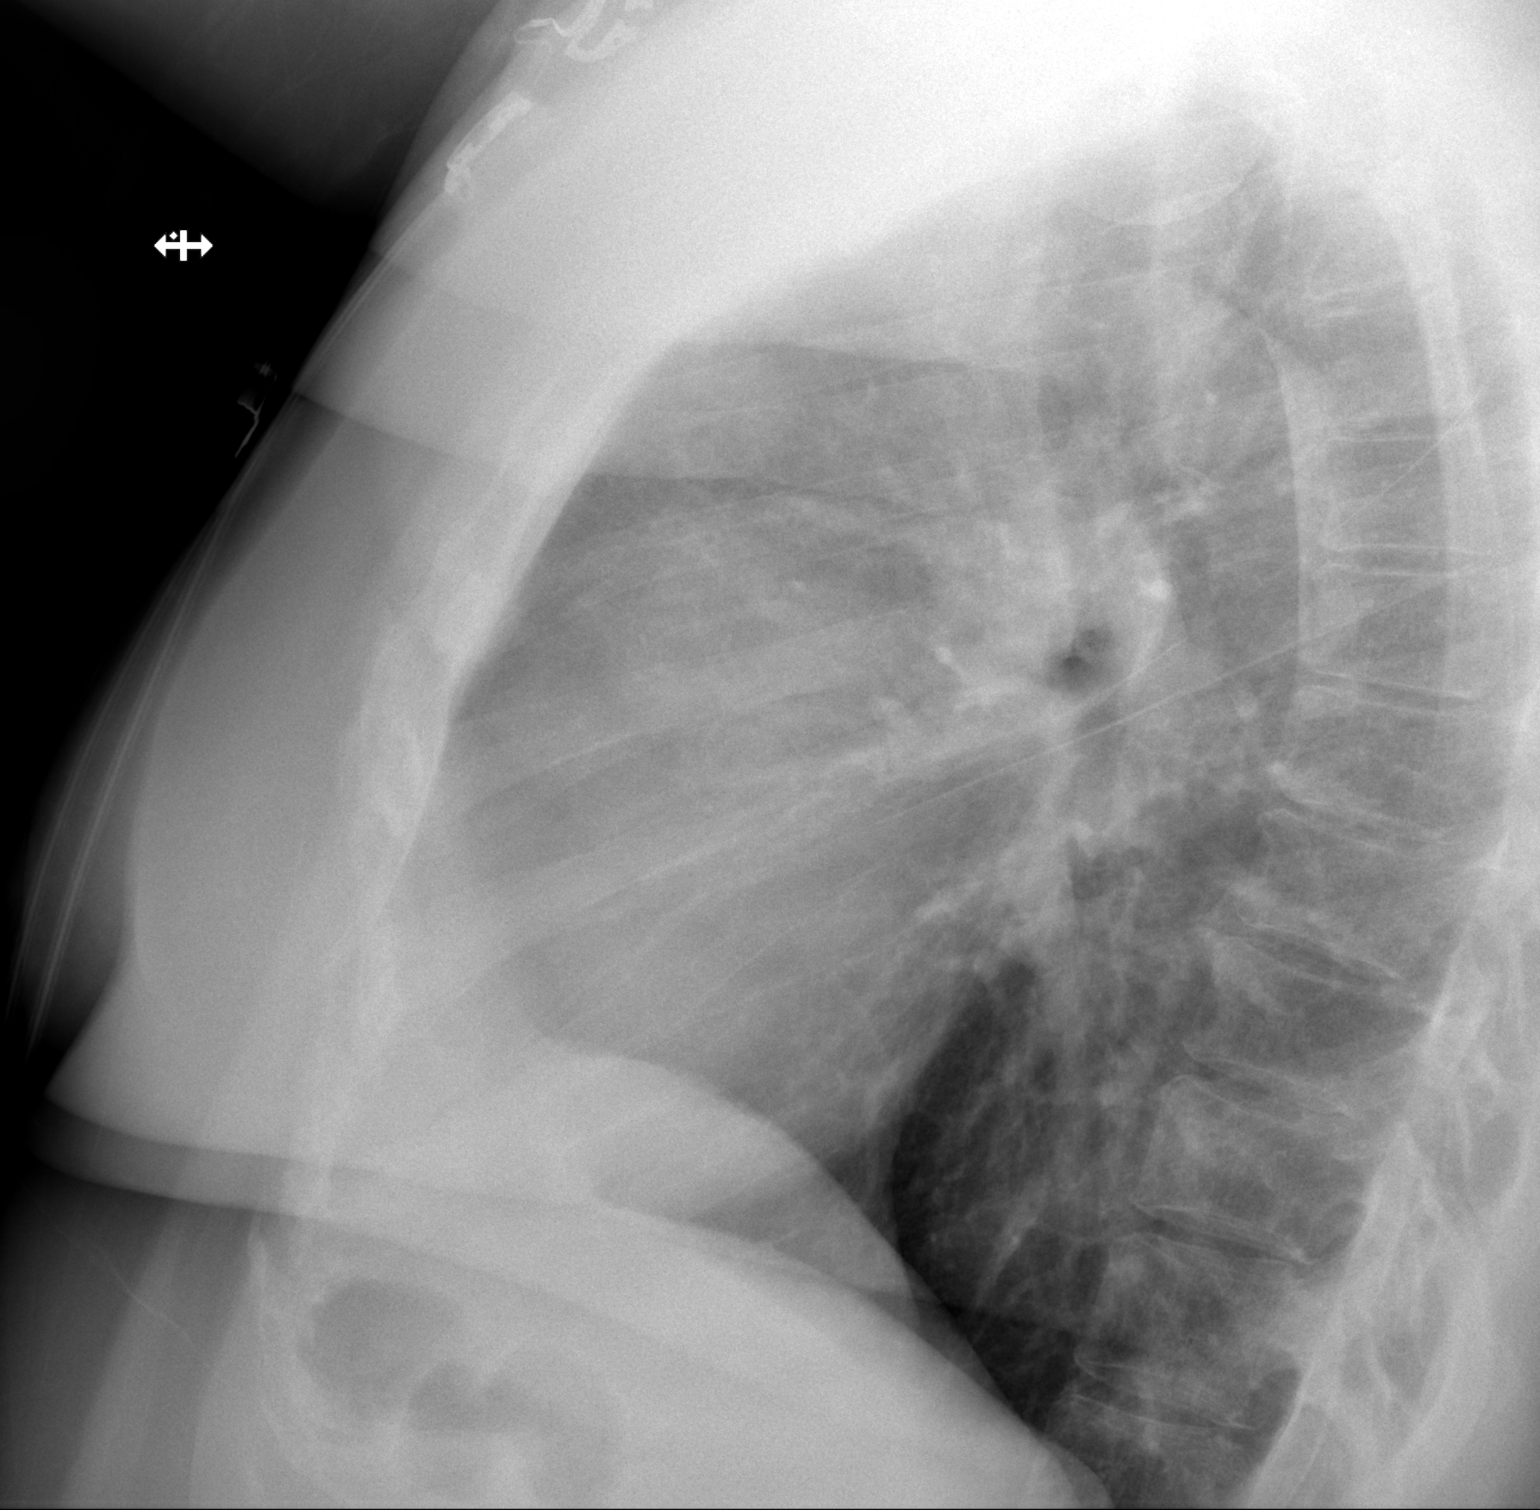

[w chest pa 8-[id] (15-22cm)]
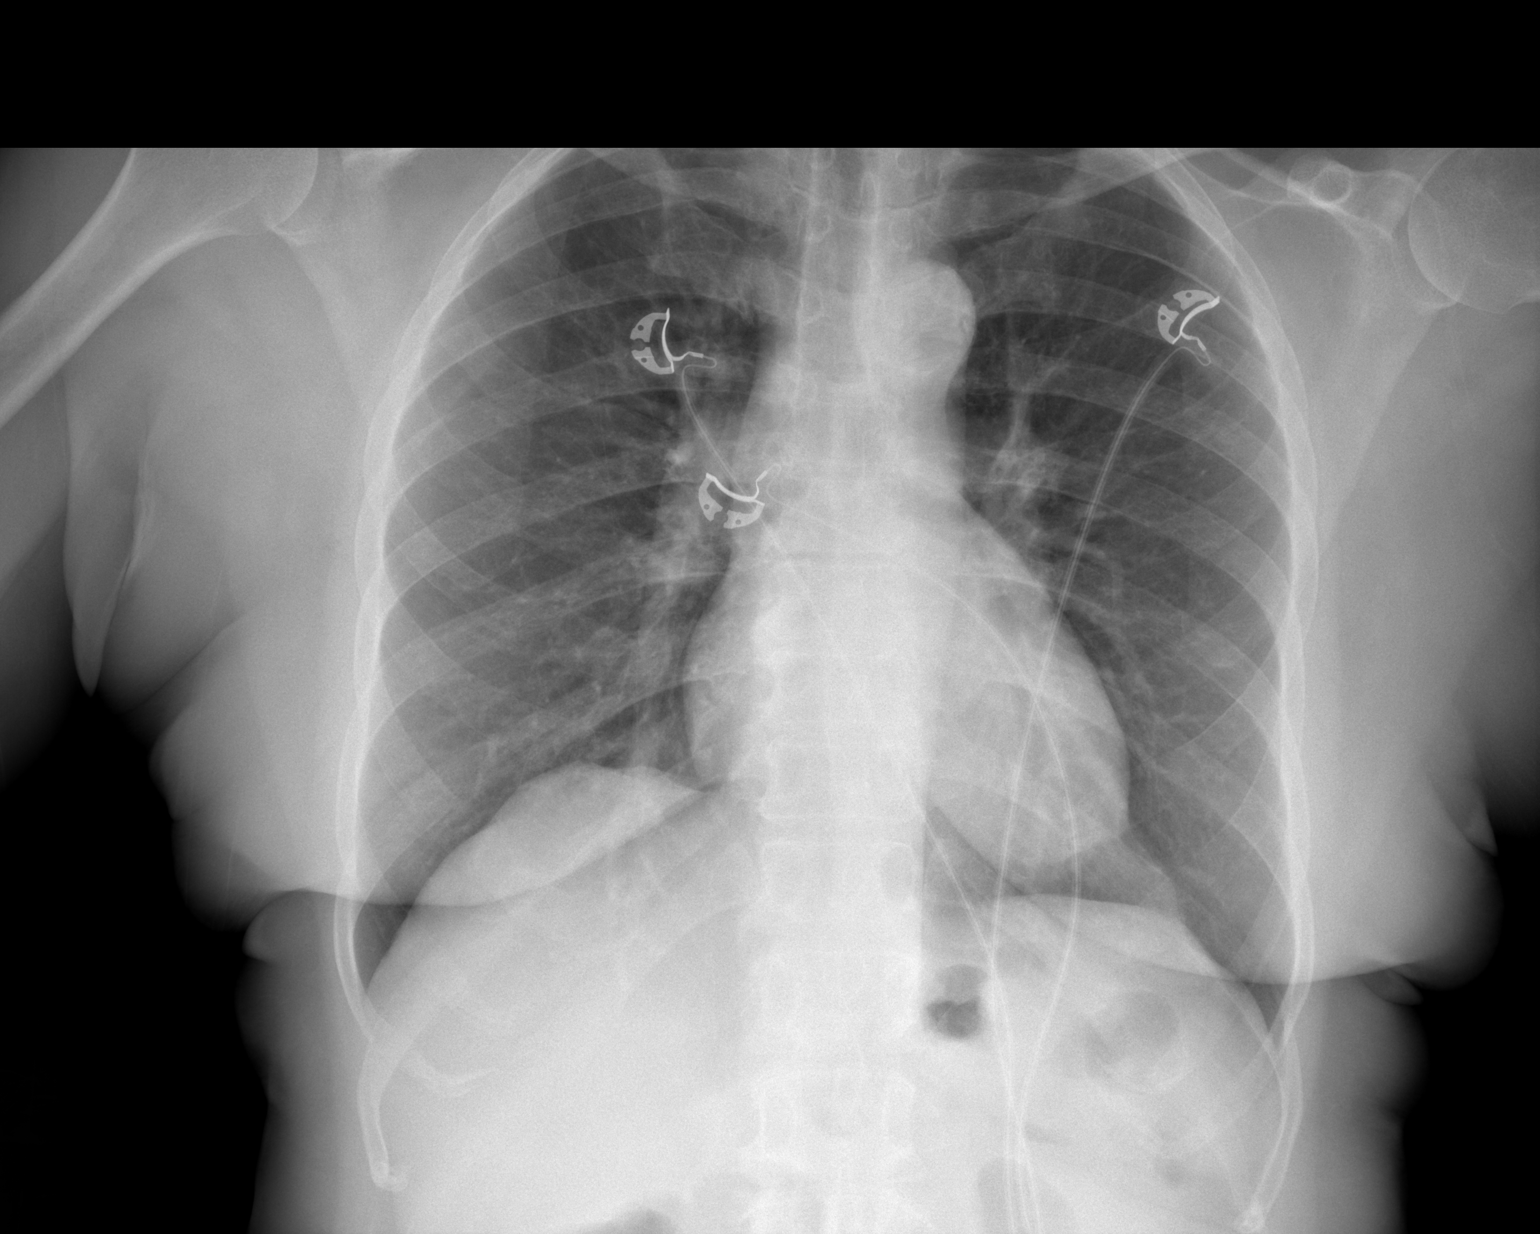

[2 of 2 positions shown; findings below may reference images not displayed]

FINDINGS: Monitoring leads overlie the patient. Normal cardiac and mediastinal
contours. No consolidative pulmonary opacities. No pleural effusion
or pneumothorax. Thoracic spine degenerative changes.
IMPRESSION: No acute cardiopulmonary process.

## 2020-11-14 DIAGNOSIS — G4700 Insomnia, unspecified: Secondary | ICD-10-CM | POA: Diagnosis not present

## 2020-11-14 DIAGNOSIS — J201 Acute bronchitis due to Hemophilus influenzae: Secondary | ICD-10-CM | POA: Diagnosis not present

## 2020-11-14 DIAGNOSIS — K219 Gastro-esophageal reflux disease without esophagitis: Secondary | ICD-10-CM | POA: Diagnosis not present

## 2020-11-14 DIAGNOSIS — Z6827 Body mass index (BMI) 27.0-27.9, adult: Secondary | ICD-10-CM | POA: Diagnosis not present

## 2020-12-02 DIAGNOSIS — J029 Acute pharyngitis, unspecified: Secondary | ICD-10-CM | POA: Diagnosis not present

## 2020-12-02 DIAGNOSIS — K219 Gastro-esophageal reflux disease without esophagitis: Secondary | ICD-10-CM | POA: Diagnosis not present

## 2020-12-02 DIAGNOSIS — Z6826 Body mass index (BMI) 26.0-26.9, adult: Secondary | ICD-10-CM | POA: Diagnosis not present

## 2020-12-02 DIAGNOSIS — G4700 Insomnia, unspecified: Secondary | ICD-10-CM | POA: Diagnosis not present

## 2021-02-17 DIAGNOSIS — Z6828 Body mass index (BMI) 28.0-28.9, adult: Secondary | ICD-10-CM | POA: Diagnosis not present

## 2021-02-17 DIAGNOSIS — M5459 Other low back pain: Secondary | ICD-10-CM | POA: Diagnosis not present

## 2021-03-10 DIAGNOSIS — M47815 Spondylosis without myelopathy or radiculopathy, thoracolumbar region: Secondary | ICD-10-CM | POA: Diagnosis not present

## 2021-03-10 DIAGNOSIS — M47814 Spondylosis without myelopathy or radiculopathy, thoracic region: Secondary | ICD-10-CM | POA: Diagnosis not present

## 2021-03-10 DIAGNOSIS — M545 Low back pain, unspecified: Secondary | ICD-10-CM | POA: Diagnosis not present

## 2021-08-25 DIAGNOSIS — K219 Gastro-esophageal reflux disease without esophagitis: Secondary | ICD-10-CM | POA: Diagnosis not present

## 2021-08-25 DIAGNOSIS — M5459 Other low back pain: Secondary | ICD-10-CM | POA: Diagnosis not present

## 2021-08-25 DIAGNOSIS — I10 Essential (primary) hypertension: Secondary | ICD-10-CM | POA: Diagnosis not present

## 2021-08-25 DIAGNOSIS — Z6826 Body mass index (BMI) 26.0-26.9, adult: Secondary | ICD-10-CM | POA: Diagnosis not present

## 2022-01-14 DIAGNOSIS — Z0000 Encounter for general adult medical examination without abnormal findings: Secondary | ICD-10-CM | POA: Diagnosis not present

## 2022-01-14 DIAGNOSIS — Z6825 Body mass index (BMI) 25.0-25.9, adult: Secondary | ICD-10-CM | POA: Diagnosis not present

## 2022-01-14 DIAGNOSIS — I10 Essential (primary) hypertension: Secondary | ICD-10-CM | POA: Diagnosis not present

## 2022-01-14 DIAGNOSIS — K219 Gastro-esophageal reflux disease without esophagitis: Secondary | ICD-10-CM | POA: Diagnosis not present

## 2022-01-14 DIAGNOSIS — M5459 Other low back pain: Secondary | ICD-10-CM | POA: Diagnosis not present

## 2022-01-20 ENCOUNTER — Encounter: Payer: Self-pay | Admitting: *Deleted

## 2022-02-26 DIAGNOSIS — H5213 Myopia, bilateral: Secondary | ICD-10-CM | POA: Diagnosis not present

## 2022-03-06 DIAGNOSIS — H25812 Combined forms of age-related cataract, left eye: Secondary | ICD-10-CM | POA: Diagnosis not present

## 2022-03-06 DIAGNOSIS — H25811 Combined forms of age-related cataract, right eye: Secondary | ICD-10-CM | POA: Diagnosis not present

## 2022-03-06 DIAGNOSIS — H401131 Primary open-angle glaucoma, bilateral, mild stage: Secondary | ICD-10-CM | POA: Diagnosis not present

## 2022-03-10 DIAGNOSIS — J40 Bronchitis, not specified as acute or chronic: Secondary | ICD-10-CM | POA: Diagnosis not present

## 2022-03-10 DIAGNOSIS — Z6824 Body mass index (BMI) 24.0-24.9, adult: Secondary | ICD-10-CM | POA: Diagnosis not present

## 2022-03-12 DIAGNOSIS — Z885 Allergy status to narcotic agent status: Secondary | ICD-10-CM | POA: Diagnosis not present

## 2022-03-12 DIAGNOSIS — R531 Weakness: Secondary | ICD-10-CM | POA: Diagnosis not present

## 2022-03-12 DIAGNOSIS — R079 Chest pain, unspecified: Secondary | ICD-10-CM | POA: Diagnosis not present

## 2022-03-12 DIAGNOSIS — Z79899 Other long term (current) drug therapy: Secondary | ICD-10-CM | POA: Diagnosis not present

## 2022-03-12 DIAGNOSIS — Z886 Allergy status to analgesic agent status: Secondary | ICD-10-CM | POA: Diagnosis not present

## 2022-03-12 DIAGNOSIS — R9431 Abnormal electrocardiogram [ECG] [EKG]: Secondary | ICD-10-CM | POA: Diagnosis not present

## 2022-03-12 DIAGNOSIS — Z880 Allergy status to penicillin: Secondary | ICD-10-CM | POA: Diagnosis not present

## 2022-03-12 DIAGNOSIS — K219 Gastro-esophageal reflux disease without esophagitis: Secondary | ICD-10-CM | POA: Diagnosis not present

## 2022-03-12 DIAGNOSIS — R0789 Other chest pain: Secondary | ICD-10-CM | POA: Diagnosis not present

## 2022-03-17 DIAGNOSIS — K219 Gastro-esophageal reflux disease without esophagitis: Secondary | ICD-10-CM | POA: Diagnosis not present

## 2022-03-17 DIAGNOSIS — Z6823 Body mass index (BMI) 23.0-23.9, adult: Secondary | ICD-10-CM | POA: Diagnosis not present

## 2022-03-17 DIAGNOSIS — R1084 Generalized abdominal pain: Secondary | ICD-10-CM | POA: Diagnosis not present

## 2022-03-17 DIAGNOSIS — I10 Essential (primary) hypertension: Secondary | ICD-10-CM | POA: Diagnosis not present

## 2022-03-19 ENCOUNTER — Ambulatory Visit: Payer: Self-pay

## 2022-03-19 ENCOUNTER — Telehealth: Payer: Self-pay | Admitting: *Deleted

## 2022-03-19 NOTE — Telephone Encounter (Signed)
Called pt for 4:00 nurse visit by phone x 3 times.  One number has fast busy signal (cell).  Left message on her voice mail (home) for pt. ?

## 2022-03-26 DIAGNOSIS — Z886 Allergy status to analgesic agent status: Secondary | ICD-10-CM | POA: Diagnosis not present

## 2022-03-26 DIAGNOSIS — R9431 Abnormal electrocardiogram [ECG] [EKG]: Secondary | ICD-10-CM | POA: Diagnosis not present

## 2022-03-26 DIAGNOSIS — Z885 Allergy status to narcotic agent status: Secondary | ICD-10-CM | POA: Diagnosis not present

## 2022-03-26 DIAGNOSIS — I10 Essential (primary) hypertension: Secondary | ICD-10-CM | POA: Diagnosis not present

## 2022-03-26 DIAGNOSIS — R42 Dizziness and giddiness: Secondary | ICD-10-CM | POA: Diagnosis not present

## 2022-03-26 DIAGNOSIS — Z79899 Other long term (current) drug therapy: Secondary | ICD-10-CM | POA: Diagnosis not present

## 2022-03-26 DIAGNOSIS — Z880 Allergy status to penicillin: Secondary | ICD-10-CM | POA: Diagnosis not present

## 2022-04-15 DIAGNOSIS — K219 Gastro-esophageal reflux disease without esophagitis: Secondary | ICD-10-CM | POA: Diagnosis not present

## 2022-04-15 DIAGNOSIS — Z6823 Body mass index (BMI) 23.0-23.9, adult: Secondary | ICD-10-CM | POA: Diagnosis not present

## 2022-04-15 DIAGNOSIS — I10 Essential (primary) hypertension: Secondary | ICD-10-CM | POA: Diagnosis not present

## 2022-04-15 DIAGNOSIS — Z0000 Encounter for general adult medical examination without abnormal findings: Secondary | ICD-10-CM | POA: Diagnosis not present

## 2022-04-15 DIAGNOSIS — E7849 Other hyperlipidemia: Secondary | ICD-10-CM | POA: Diagnosis not present

## 2022-04-15 DIAGNOSIS — R1084 Generalized abdominal pain: Secondary | ICD-10-CM | POA: Diagnosis not present

## 2022-10-20 DIAGNOSIS — Z0000 Encounter for general adult medical examination without abnormal findings: Secondary | ICD-10-CM | POA: Diagnosis not present

## 2022-10-20 DIAGNOSIS — K219 Gastro-esophageal reflux disease without esophagitis: Secondary | ICD-10-CM | POA: Diagnosis not present

## 2022-10-20 DIAGNOSIS — Z6827 Body mass index (BMI) 27.0-27.9, adult: Secondary | ICD-10-CM | POA: Diagnosis not present

## 2022-10-20 DIAGNOSIS — I10 Essential (primary) hypertension: Secondary | ICD-10-CM | POA: Diagnosis not present

## 2022-10-20 DIAGNOSIS — E7849 Other hyperlipidemia: Secondary | ICD-10-CM | POA: Diagnosis not present

## 2023-01-25 DIAGNOSIS — J208 Acute bronchitis due to other specified organisms: Secondary | ICD-10-CM | POA: Diagnosis not present

## 2023-01-25 DIAGNOSIS — K219 Gastro-esophageal reflux disease without esophagitis: Secondary | ICD-10-CM | POA: Diagnosis not present

## 2023-01-25 DIAGNOSIS — I10 Essential (primary) hypertension: Secondary | ICD-10-CM | POA: Diagnosis not present

## 2023-01-25 DIAGNOSIS — Z6826 Body mass index (BMI) 26.0-26.9, adult: Secondary | ICD-10-CM | POA: Diagnosis not present

## 2023-01-25 DIAGNOSIS — E7849 Other hyperlipidemia: Secondary | ICD-10-CM | POA: Diagnosis not present

## 2023-06-01 DIAGNOSIS — M5431 Sciatica, right side: Secondary | ICD-10-CM | POA: Diagnosis not present

## 2023-06-01 DIAGNOSIS — K219 Gastro-esophageal reflux disease without esophagitis: Secondary | ICD-10-CM | POA: Diagnosis not present

## 2023-06-01 DIAGNOSIS — E7849 Other hyperlipidemia: Secondary | ICD-10-CM | POA: Diagnosis not present

## 2023-06-01 DIAGNOSIS — Z6825 Body mass index (BMI) 25.0-25.9, adult: Secondary | ICD-10-CM | POA: Diagnosis not present

## 2023-06-01 DIAGNOSIS — I10 Essential (primary) hypertension: Secondary | ICD-10-CM | POA: Diagnosis not present

## 2023-06-01 DIAGNOSIS — Z0000 Encounter for general adult medical examination without abnormal findings: Secondary | ICD-10-CM | POA: Diagnosis not present

## 2023-06-01 DIAGNOSIS — J208 Acute bronchitis due to other specified organisms: Secondary | ICD-10-CM | POA: Diagnosis not present

## 2023-06-26 DIAGNOSIS — I10 Essential (primary) hypertension: Secondary | ICD-10-CM | POA: Diagnosis not present

## 2023-06-26 DIAGNOSIS — W01198A Fall on same level from slipping, tripping and stumbling with subsequent striking against other object, initial encounter: Secondary | ICD-10-CM | POA: Diagnosis not present

## 2023-06-26 DIAGNOSIS — S0990XA Unspecified injury of head, initial encounter: Secondary | ICD-10-CM | POA: Diagnosis not present

## 2023-06-26 DIAGNOSIS — R42 Dizziness and giddiness: Secondary | ICD-10-CM | POA: Diagnosis not present

## 2023-06-26 DIAGNOSIS — M549 Dorsalgia, unspecified: Secondary | ICD-10-CM | POA: Diagnosis not present

## 2023-06-26 DIAGNOSIS — Z885 Allergy status to narcotic agent status: Secondary | ICD-10-CM | POA: Diagnosis not present

## 2023-06-26 DIAGNOSIS — W0110XA Fall on same level from slipping, tripping and stumbling with subsequent striking against unspecified object, initial encounter: Secondary | ICD-10-CM | POA: Diagnosis not present

## 2023-06-26 DIAGNOSIS — E785 Hyperlipidemia, unspecified: Secondary | ICD-10-CM | POA: Diagnosis not present

## 2023-06-26 DIAGNOSIS — Z886 Allergy status to analgesic agent status: Secondary | ICD-10-CM | POA: Diagnosis not present

## 2023-06-26 DIAGNOSIS — Z880 Allergy status to penicillin: Secondary | ICD-10-CM | POA: Diagnosis not present

## 2023-07-06 DIAGNOSIS — G4700 Insomnia, unspecified: Secondary | ICD-10-CM | POA: Diagnosis not present

## 2023-07-06 DIAGNOSIS — I10 Essential (primary) hypertension: Secondary | ICD-10-CM | POA: Diagnosis not present

## 2023-07-06 DIAGNOSIS — S0091XA Abrasion of unspecified part of head, initial encounter: Secondary | ICD-10-CM | POA: Diagnosis not present

## 2023-07-06 DIAGNOSIS — Z6824 Body mass index (BMI) 24.0-24.9, adult: Secondary | ICD-10-CM | POA: Diagnosis not present

## 2023-09-04 DIAGNOSIS — Z885 Allergy status to narcotic agent status: Secondary | ICD-10-CM | POA: Diagnosis not present

## 2023-09-04 DIAGNOSIS — I10 Essential (primary) hypertension: Secondary | ICD-10-CM | POA: Diagnosis not present

## 2023-09-04 DIAGNOSIS — E876 Hypokalemia: Secondary | ICD-10-CM | POA: Diagnosis not present

## 2023-09-04 DIAGNOSIS — R7989 Other specified abnormal findings of blood chemistry: Secondary | ICD-10-CM | POA: Diagnosis not present

## 2023-09-04 DIAGNOSIS — K5732 Diverticulitis of large intestine without perforation or abscess without bleeding: Secondary | ICD-10-CM | POA: Diagnosis not present

## 2023-09-04 DIAGNOSIS — Z886 Allergy status to analgesic agent status: Secondary | ICD-10-CM | POA: Diagnosis not present

## 2023-09-04 DIAGNOSIS — I700 Atherosclerosis of aorta: Secondary | ICD-10-CM | POA: Diagnosis not present

## 2023-09-04 DIAGNOSIS — R112 Nausea with vomiting, unspecified: Secondary | ICD-10-CM | POA: Diagnosis not present

## 2023-09-04 DIAGNOSIS — R109 Unspecified abdominal pain: Secondary | ICD-10-CM | POA: Diagnosis not present

## 2023-09-04 DIAGNOSIS — R197 Diarrhea, unspecified: Secondary | ICD-10-CM | POA: Diagnosis not present

## 2023-09-04 DIAGNOSIS — Z880 Allergy status to penicillin: Secondary | ICD-10-CM | POA: Diagnosis not present

## 2023-09-04 DIAGNOSIS — K5730 Diverticulosis of large intestine without perforation or abscess without bleeding: Secondary | ICD-10-CM | POA: Diagnosis not present

## 2023-09-04 DIAGNOSIS — R7402 Elevation of levels of lactic acid dehydrogenase (LDH): Secondary | ICD-10-CM | POA: Diagnosis not present

## 2023-09-04 DIAGNOSIS — R935 Abnormal findings on diagnostic imaging of other abdominal regions, including retroperitoneum: Secondary | ICD-10-CM | POA: Diagnosis not present

## 2023-09-04 DIAGNOSIS — R1030 Lower abdominal pain, unspecified: Secondary | ICD-10-CM | POA: Diagnosis not present

## 2023-09-04 DIAGNOSIS — K5792 Diverticulitis of intestine, part unspecified, without perforation or abscess without bleeding: Secondary | ICD-10-CM | POA: Diagnosis not present

## 2023-09-04 DIAGNOSIS — D72829 Elevated white blood cell count, unspecified: Secondary | ICD-10-CM | POA: Diagnosis not present

## 2023-09-04 DIAGNOSIS — Z79899 Other long term (current) drug therapy: Secondary | ICD-10-CM | POA: Diagnosis not present

## 2023-09-04 DIAGNOSIS — E785 Hyperlipidemia, unspecified: Secondary | ICD-10-CM | POA: Diagnosis not present

## 2023-09-04 DIAGNOSIS — I6602 Occlusion and stenosis of left middle cerebral artery: Secondary | ICD-10-CM | POA: Diagnosis not present

## 2023-09-09 DIAGNOSIS — Z6822 Body mass index (BMI) 22.0-22.9, adult: Secondary | ICD-10-CM | POA: Diagnosis not present

## 2023-09-09 DIAGNOSIS — K5741 Diverticulitis of both small and large intestine with perforation and abscess with bleeding: Secondary | ICD-10-CM | POA: Diagnosis not present

## 2023-09-09 DIAGNOSIS — I10 Essential (primary) hypertension: Secondary | ICD-10-CM | POA: Diagnosis not present

## 2023-09-11 DIAGNOSIS — L509 Urticaria, unspecified: Secondary | ICD-10-CM | POA: Diagnosis not present

## 2023-09-11 DIAGNOSIS — Z885 Allergy status to narcotic agent status: Secondary | ICD-10-CM | POA: Diagnosis not present

## 2023-09-11 DIAGNOSIS — X58XXXA Exposure to other specified factors, initial encounter: Secondary | ICD-10-CM | POA: Diagnosis not present

## 2023-09-11 DIAGNOSIS — E785 Hyperlipidemia, unspecified: Secondary | ICD-10-CM | POA: Diagnosis not present

## 2023-09-11 DIAGNOSIS — Z886 Allergy status to analgesic agent status: Secondary | ICD-10-CM | POA: Diagnosis not present

## 2023-09-11 DIAGNOSIS — R1030 Lower abdominal pain, unspecified: Secondary | ICD-10-CM | POA: Diagnosis not present

## 2023-09-11 DIAGNOSIS — Z880 Allergy status to penicillin: Secondary | ICD-10-CM | POA: Diagnosis not present

## 2023-09-11 DIAGNOSIS — T7840XA Allergy, unspecified, initial encounter: Secondary | ICD-10-CM | POA: Diagnosis not present

## 2023-09-11 DIAGNOSIS — I10 Essential (primary) hypertension: Secondary | ICD-10-CM | POA: Diagnosis not present

## 2023-09-29 DIAGNOSIS — E7849 Other hyperlipidemia: Secondary | ICD-10-CM | POA: Diagnosis not present

## 2023-09-29 DIAGNOSIS — I10 Essential (primary) hypertension: Secondary | ICD-10-CM | POA: Diagnosis not present

## 2023-09-29 DIAGNOSIS — G4700 Insomnia, unspecified: Secondary | ICD-10-CM | POA: Diagnosis not present

## 2023-09-29 DIAGNOSIS — Z0000 Encounter for general adult medical examination without abnormal findings: Secondary | ICD-10-CM | POA: Diagnosis not present

## 2023-09-29 DIAGNOSIS — K219 Gastro-esophageal reflux disease without esophagitis: Secondary | ICD-10-CM | POA: Diagnosis not present

## 2023-09-29 DIAGNOSIS — M5431 Sciatica, right side: Secondary | ICD-10-CM | POA: Diagnosis not present

## 2023-09-29 DIAGNOSIS — Z6823 Body mass index (BMI) 23.0-23.9, adult: Secondary | ICD-10-CM | POA: Diagnosis not present

## 2023-10-04 DIAGNOSIS — Z1159 Encounter for screening for other viral diseases: Secondary | ICD-10-CM | POA: Diagnosis not present

## 2023-10-12 ENCOUNTER — Encounter (INDEPENDENT_AMBULATORY_CARE_PROVIDER_SITE_OTHER): Payer: Self-pay | Admitting: *Deleted

## 2023-10-25 DIAGNOSIS — R1010 Upper abdominal pain, unspecified: Secondary | ICD-10-CM | POA: Diagnosis not present

## 2023-10-25 DIAGNOSIS — Z880 Allergy status to penicillin: Secondary | ICD-10-CM | POA: Diagnosis not present

## 2023-10-25 DIAGNOSIS — E785 Hyperlipidemia, unspecified: Secondary | ICD-10-CM | POA: Diagnosis not present

## 2023-10-25 DIAGNOSIS — N3000 Acute cystitis without hematuria: Secondary | ICD-10-CM | POA: Diagnosis not present

## 2023-10-25 DIAGNOSIS — Z886 Allergy status to analgesic agent status: Secondary | ICD-10-CM | POA: Diagnosis not present

## 2023-10-25 DIAGNOSIS — Z882 Allergy status to sulfonamides status: Secondary | ICD-10-CM | POA: Diagnosis not present

## 2023-10-25 DIAGNOSIS — I10 Essential (primary) hypertension: Secondary | ICD-10-CM | POA: Diagnosis not present

## 2023-10-25 DIAGNOSIS — R1030 Lower abdominal pain, unspecified: Secondary | ICD-10-CM | POA: Diagnosis not present

## 2023-10-25 DIAGNOSIS — K2900 Acute gastritis without bleeding: Secondary | ICD-10-CM | POA: Diagnosis not present

## 2023-10-25 DIAGNOSIS — Z885 Allergy status to narcotic agent status: Secondary | ICD-10-CM | POA: Diagnosis not present

## 2023-11-02 DIAGNOSIS — R1084 Generalized abdominal pain: Secondary | ICD-10-CM | POA: Diagnosis not present

## 2023-11-02 DIAGNOSIS — I10 Essential (primary) hypertension: Secondary | ICD-10-CM | POA: Diagnosis not present

## 2023-11-02 DIAGNOSIS — Z6824 Body mass index (BMI) 24.0-24.9, adult: Secondary | ICD-10-CM | POA: Diagnosis not present

## 2024-01-04 DIAGNOSIS — I10 Essential (primary) hypertension: Secondary | ICD-10-CM | POA: Diagnosis not present

## 2024-01-04 DIAGNOSIS — K219 Gastro-esophageal reflux disease without esophagitis: Secondary | ICD-10-CM | POA: Diagnosis not present

## 2024-01-04 DIAGNOSIS — R1084 Generalized abdominal pain: Secondary | ICD-10-CM | POA: Diagnosis not present

## 2024-01-04 DIAGNOSIS — E7849 Other hyperlipidemia: Secondary | ICD-10-CM | POA: Diagnosis not present

## 2024-01-04 DIAGNOSIS — Z0000 Encounter for general adult medical examination without abnormal findings: Secondary | ICD-10-CM | POA: Diagnosis not present

## 2024-01-04 DIAGNOSIS — Z6826 Body mass index (BMI) 26.0-26.9, adult: Secondary | ICD-10-CM | POA: Diagnosis not present

## 2024-04-07 DIAGNOSIS — I10 Essential (primary) hypertension: Secondary | ICD-10-CM | POA: Diagnosis not present

## 2024-04-07 DIAGNOSIS — E7849 Other hyperlipidemia: Secondary | ICD-10-CM | POA: Diagnosis not present

## 2024-04-07 DIAGNOSIS — J449 Chronic obstructive pulmonary disease, unspecified: Secondary | ICD-10-CM | POA: Diagnosis not present

## 2024-04-07 DIAGNOSIS — Z6825 Body mass index (BMI) 25.0-25.9, adult: Secondary | ICD-10-CM | POA: Diagnosis not present

## 2024-04-07 DIAGNOSIS — K219 Gastro-esophageal reflux disease without esophagitis: Secondary | ICD-10-CM | POA: Diagnosis not present

## 2024-04-07 DIAGNOSIS — Z0000 Encounter for general adult medical examination without abnormal findings: Secondary | ICD-10-CM | POA: Diagnosis not present

## 2024-04-11 ENCOUNTER — Encounter (INDEPENDENT_AMBULATORY_CARE_PROVIDER_SITE_OTHER): Payer: Self-pay | Admitting: *Deleted

## 2024-08-01 DIAGNOSIS — Z6823 Body mass index (BMI) 23.0-23.9, adult: Secondary | ICD-10-CM | POA: Diagnosis not present

## 2024-08-01 DIAGNOSIS — Z0000 Encounter for general adult medical examination without abnormal findings: Secondary | ICD-10-CM | POA: Diagnosis not present

## 2024-08-01 DIAGNOSIS — J449 Chronic obstructive pulmonary disease, unspecified: Secondary | ICD-10-CM | POA: Diagnosis not present

## 2024-08-01 DIAGNOSIS — I10 Essential (primary) hypertension: Secondary | ICD-10-CM | POA: Diagnosis not present

## 2024-08-01 DIAGNOSIS — K219 Gastro-esophageal reflux disease without esophagitis: Secondary | ICD-10-CM | POA: Diagnosis not present

## 2024-08-01 DIAGNOSIS — E7849 Other hyperlipidemia: Secondary | ICD-10-CM | POA: Diagnosis not present

## 2024-09-25 DIAGNOSIS — J449 Chronic obstructive pulmonary disease, unspecified: Secondary | ICD-10-CM | POA: Diagnosis not present

## 2024-09-25 DIAGNOSIS — I10 Essential (primary) hypertension: Secondary | ICD-10-CM | POA: Diagnosis not present

## 2024-11-06 DIAGNOSIS — X58XXXA Exposure to other specified factors, initial encounter: Secondary | ICD-10-CM | POA: Diagnosis not present

## 2024-11-06 DIAGNOSIS — I10 Essential (primary) hypertension: Secondary | ICD-10-CM | POA: Diagnosis not present

## 2024-11-06 DIAGNOSIS — X501XXA Overexertion from prolonged static or awkward postures, initial encounter: Secondary | ICD-10-CM | POA: Diagnosis not present

## 2024-11-06 DIAGNOSIS — S46911A Strain of unspecified muscle, fascia and tendon at shoulder and upper arm level, right arm, initial encounter: Secondary | ICD-10-CM | POA: Diagnosis not present

## 2024-11-06 DIAGNOSIS — Z886 Allergy status to analgesic agent status: Secondary | ICD-10-CM | POA: Diagnosis not present

## 2024-11-06 DIAGNOSIS — Z79899 Other long term (current) drug therapy: Secondary | ICD-10-CM | POA: Diagnosis not present

## 2024-11-06 DIAGNOSIS — E785 Hyperlipidemia, unspecified: Secondary | ICD-10-CM | POA: Diagnosis not present

## 2024-11-06 DIAGNOSIS — S4381XA Sprain of other specified parts of right shoulder girdle, initial encounter: Secondary | ICD-10-CM | POA: Diagnosis not present

## 2024-11-06 DIAGNOSIS — M19011 Primary osteoarthritis, right shoulder: Secondary | ICD-10-CM | POA: Diagnosis not present

## 2024-11-06 DIAGNOSIS — Z885 Allergy status to narcotic agent status: Secondary | ICD-10-CM | POA: Diagnosis not present

## 2024-11-06 DIAGNOSIS — S43491A Other sprain of right shoulder joint, initial encounter: Secondary | ICD-10-CM | POA: Diagnosis not present

## 2024-11-06 DIAGNOSIS — M25511 Pain in right shoulder: Secondary | ICD-10-CM | POA: Diagnosis not present

## 2024-11-06 DIAGNOSIS — M549 Dorsalgia, unspecified: Secondary | ICD-10-CM | POA: Diagnosis not present

## 2024-11-06 NOTE — ED Provider Notes (Signed)
 [facility]  Emergency Department Provider Note     History   Chief Complaint Shoulder Pain   HPI  Molly Cook is a 73 y.o. female right medial periscapular pain when the patient was hanging some lights on a tree a couple of days ago.  Patient has no numbness or weakness of the shoulder muscle or girdle or arms.  No fall or injury or heavy lifting pulling pushing.    Past Medical History: No date: Hyperlipemia No date: Hypertension  No past surgical history on file.  Prior to Admission medications  Medication Dose, Route, Frequency  amLODIPine (NORVASC) 5 MG tablet 5 mg, Oral, Daily (standard)  cetirizine (ZYRTEC) 10 MG tablet No dose, route, or frequency recorded.  EPINEPHrine (EPIPEN 2-PAK) 0.3 mg/0.3 mL injection 0.3 mg, Intramuscular, Once  melatonin 10 mg cap 10 mg, Oral, Nightly  pravastatin (PRAVACHOL) 20 MG tablet No dose, route, or frequency recorded.  predniSONE (DELTASONE) 20 MG tablet 3 po daily for 2 days, then 2 po daily for 2 days, then 1 po daily for 2 days    Allergies Aspirin, Codeine, Penicillins, Bactrim [sulfamethoxazole-trimethoprim], and Linzess [linaclotide]   Short Social History[1]  Review of Systems  Musculoskeletal:  Positive for joint pain.       Right medial periscapular pain, shoulder pain    As in HPI, all systems reviewed and otherwise negative.  Physical Exam    Vitals:   11/06/24 1249  BP: 161/83  Pulse: 64  Resp: 14  Temp: 36.6 C (97.8 F)  TempSrc: Temporal  SpO2: 99%  Weight: 63.2 kg (139 lb 6.4 oz)  Height: 157.5 cm (5' 2)     Physical Exam  Constitutional: Patient appears well-developed and well nourished. Non toxic in appearance. HEENT: Unremarkable. Head: Atraumatic.  Eyes: Normal ocular movements. Neck: Supple with normal range of motion.  Pulmonary/Chest: Effort normal. No respiratory distress. Abdominal: Soft and non tender abdomen. Musculoskeletal: Extremities atraumatic.  Patient  has reproducible tenderness at the medial periscapular region on the right side.  There is no induration fluctuance erythema edema ecchymosis hematoma.  Patient has full range of motion of the scapula on the right side full range of motion of the right shoulder as well. Neurological: Alert with no focal neurological deficit. Ambulatory with a steady gait. Skin: Warm and dry.  Nursing note and vital signs reviewed.   ED Course        Procedures  Medications ordered during this encounter  Medications   lidocaine (ASPERCREME) 4 % 1 patch   acetaminophen (TYLENOL) tablet 650 mg    ED Results No results found for any visits on 11/06/24.  Radiology No results found.   Medical Decision Making   I have reviewed the vital signs and the nursing notes. Labs and radiology results that were available during my care of the patient were independently reviewed by me and considered in my medical decision making.    This is a 73 year old female with pain localized to the right medial periscapular region.  Medical Decision Making Amount and/or Complexity of Data Reviewed Radiology: ordered.  Risk OTC drugs.     Differential Diagnosis: Right shoulder sprain, right shoulder strain, musculoskeletal pain     ED Clinical Impression   Final diagnoses:  None   Right shoulder girdle sprain Right shoulder girdle strain  Procedures      This record has been created using Animal nutritionist. Chart creation errors have been sought, but may not always have been located. Such  creation errors do not reflect on the standard of medical care.       [1] Social History Tobacco Use   Smoking status: Never   Smokeless tobacco: Never  Vaping Use   Vaping status: Never Used  Substance Use Topics   Alcohol use: Never   Drug use: Yes    Types: Marijuana   Molly [name], MD 11/06/24 1504

## 2024-11-30 DIAGNOSIS — E7849 Other hyperlipidemia: Secondary | ICD-10-CM | POA: Diagnosis not present

## 2024-11-30 DIAGNOSIS — Z6823 Body mass index (BMI) 23.0-23.9, adult: Secondary | ICD-10-CM | POA: Diagnosis not present

## 2024-11-30 DIAGNOSIS — I10 Essential (primary) hypertension: Secondary | ICD-10-CM | POA: Diagnosis not present

## 2024-11-30 DIAGNOSIS — K219 Gastro-esophageal reflux disease without esophagitis: Secondary | ICD-10-CM | POA: Diagnosis not present

## 2024-11-30 DIAGNOSIS — J449 Chronic obstructive pulmonary disease, unspecified: Secondary | ICD-10-CM | POA: Diagnosis not present

## 2024-12-06 ENCOUNTER — Encounter (INDEPENDENT_AMBULATORY_CARE_PROVIDER_SITE_OTHER): Payer: Self-pay | Admitting: *Deleted
